# Patient Record
Sex: Male | Born: 1940 | Race: White | Hispanic: No | Marital: Married | Smoking: Former smoker
Health system: Southern US, Community
[De-identification: ages and names within clinical notes are randomized; demographics above are authoritative.]

## PROBLEM LIST (undated history)

## (undated) DIAGNOSIS — G20 Parkinson's disease: Secondary | ICD-10-CM

## (undated) HISTORY — PX: HIP SURGERY: SHX245

---

## 2015-04-03 ENCOUNTER — Emergency Department (HOSPITAL_COMMUNITY): Payer: Medicare Other

## 2015-04-03 ENCOUNTER — Inpatient Hospital Stay (HOSPITAL_COMMUNITY)
Admission: EM | Admit: 2015-04-03 | Discharge: 2015-04-14 | DRG: 922 | Disposition: A | Discharge status: Skilled Nursing Facility | Payer: Medicare Other | Attending: Internal Medicine | Admitting: Internal Medicine

## 2015-04-03 ENCOUNTER — Encounter (HOSPITAL_COMMUNITY): Payer: Self-pay | Admitting: *Deleted

## 2015-04-03 DIAGNOSIS — R569 Unspecified convulsions: Secondary | ICD-10-CM

## 2015-04-03 DIAGNOSIS — I10 Essential (primary) hypertension: Secondary | ICD-10-CM | POA: Diagnosis present

## 2015-04-03 DIAGNOSIS — X31XXXA Exposure to excessive natural cold, initial encounter: Secondary | ICD-10-CM | POA: Diagnosis not present

## 2015-04-03 DIAGNOSIS — L8990 Pressure ulcer of unspecified site, unspecified stage: Secondary | ICD-10-CM | POA: Diagnosis not present

## 2015-04-03 DIAGNOSIS — E874 Mixed disorder of acid-base balance: Secondary | ICD-10-CM | POA: Diagnosis present

## 2015-04-03 DIAGNOSIS — Z7982 Long term (current) use of aspirin: Secondary | ICD-10-CM | POA: Diagnosis not present

## 2015-04-03 DIAGNOSIS — G9340 Encephalopathy, unspecified: Secondary | ICD-10-CM | POA: Insufficient documentation

## 2015-04-03 DIAGNOSIS — E870 Hyperosmolality and hypernatremia: Secondary | ICD-10-CM | POA: Insufficient documentation

## 2015-04-03 DIAGNOSIS — J9602 Acute respiratory failure with hypercapnia: Secondary | ICD-10-CM | POA: Insufficient documentation

## 2015-04-03 DIAGNOSIS — J9600 Acute respiratory failure, unspecified whether with hypoxia or hypercapnia: Secondary | ICD-10-CM

## 2015-04-03 DIAGNOSIS — R4020 Unspecified coma: Secondary | ICD-10-CM | POA: Diagnosis present

## 2015-04-03 DIAGNOSIS — R001 Bradycardia, unspecified: Secondary | ICD-10-CM | POA: Diagnosis not present

## 2015-04-03 DIAGNOSIS — F0390 Unspecified dementia without behavioral disturbance: Secondary | ICD-10-CM | POA: Diagnosis present

## 2015-04-03 DIAGNOSIS — Z931 Gastrostomy status: Secondary | ICD-10-CM

## 2015-04-03 DIAGNOSIS — I4891 Unspecified atrial fibrillation: Secondary | ICD-10-CM | POA: Diagnosis present

## 2015-04-03 DIAGNOSIS — J9601 Acute respiratory failure with hypoxia: Secondary | ICD-10-CM | POA: Insufficient documentation

## 2015-04-03 DIAGNOSIS — D72829 Elevated white blood cell count, unspecified: Secondary | ICD-10-CM | POA: Diagnosis present

## 2015-04-03 DIAGNOSIS — Z87891 Personal history of nicotine dependence: Secondary | ICD-10-CM | POA: Diagnosis not present

## 2015-04-03 DIAGNOSIS — Z79899 Other long term (current) drug therapy: Secondary | ICD-10-CM

## 2015-04-03 DIAGNOSIS — J690 Pneumonitis due to inhalation of food and vomit: Secondary | ICD-10-CM | POA: Diagnosis present

## 2015-04-03 DIAGNOSIS — D696 Thrombocytopenia, unspecified: Secondary | ICD-10-CM | POA: Diagnosis not present

## 2015-04-03 DIAGNOSIS — G20 Parkinson's disease: Secondary | ICD-10-CM | POA: Diagnosis present

## 2015-04-03 DIAGNOSIS — T68XXXA Hypothermia, initial encounter: Principal | ICD-10-CM | POA: Diagnosis present

## 2015-04-03 DIAGNOSIS — R1310 Dysphagia, unspecified: Secondary | ICD-10-CM | POA: Insufficient documentation

## 2015-04-03 DIAGNOSIS — D62 Acute posthemorrhagic anemia: Secondary | ICD-10-CM | POA: Insufficient documentation

## 2015-04-03 DIAGNOSIS — L8995 Pressure ulcer of unspecified site, unstageable: Secondary | ICD-10-CM | POA: Diagnosis present

## 2015-04-03 DIAGNOSIS — E876 Hypokalemia: Secondary | ICD-10-CM | POA: Diagnosis not present

## 2015-04-03 DIAGNOSIS — R5381 Other malaise: Secondary | ICD-10-CM | POA: Diagnosis present

## 2015-04-03 DIAGNOSIS — D6489 Other specified anemias: Secondary | ICD-10-CM | POA: Diagnosis present

## 2015-04-03 DIAGNOSIS — E878 Other disorders of electrolyte and fluid balance, not elsewhere classified: Secondary | ICD-10-CM | POA: Diagnosis not present

## 2015-04-03 DIAGNOSIS — R740 Nonspecific elevation of levels of transaminase and lactic acid dehydrogenase [LDH]: Secondary | ICD-10-CM | POA: Diagnosis present

## 2015-04-03 DIAGNOSIS — I959 Hypotension, unspecified: Secondary | ICD-10-CM | POA: Diagnosis present

## 2015-04-03 DIAGNOSIS — R739 Hyperglycemia, unspecified: Secondary | ICD-10-CM | POA: Insufficient documentation

## 2015-04-03 DIAGNOSIS — G20A1 Parkinson's disease without dyskinesia, without mention of fluctuations: Secondary | ICD-10-CM | POA: Insufficient documentation

## 2015-04-03 DIAGNOSIS — M25521 Pain in right elbow: Secondary | ICD-10-CM | POA: Insufficient documentation

## 2015-04-03 HISTORY — DX: Parkinson's disease: G20

## 2015-04-03 LAB — BASIC METABOLIC PANEL
ANION GAP: 6 (ref 5–15)
Anion gap: 17 — ABNORMAL HIGH (ref 5–15)
BUN: 29 mg/dL — ABNORMAL HIGH (ref 6–20)
BUN: 27 mg/dL — ABNORMAL HIGH (ref 6–20)
CALCIUM: 7.8 mg/dL — AB (ref 8.9–10.3)
CO2: 16 mmol/L — AB (ref 22–32)
CO2: 22 mmol/L (ref 22–32)
CREATININE: 1.06 mg/dL (ref 0.61–1.24)
Calcium: 8.1 mg/dL — ABNORMAL LOW (ref 8.9–10.3)
Chloride: 110 mmol/L (ref 101–111)
Chloride: 116 mmol/L — ABNORMAL HIGH (ref 101–111)
Creatinine, Ser: 1.15 mg/dL (ref 0.61–1.24)
GFR calc Af Amer: >60 mL/min (ref >60)
GLUCOSE: 125 mg/dL — AB (ref 65–99)
Glucose, Bld: 89 mg/dL (ref 65–99)
Potassium: 3.5 mmol/L (ref 3.5–5.1)
Potassium: 3.8 mmol/L (ref 3.5–5.1)
SODIUM: 143 mmol/L (ref 135–145)
SODIUM: 144 mmol/L (ref 135–145)

## 2015-04-03 LAB — URINALYSIS, ROUTINE W REFLEX MICROSCOPIC
BILIRUBIN URINE: NEGATIVE
GLUCOSE, UA: 100 mg/dL — AB
Ketones, ur: 15 mg/dL — AB
Leukocytes, UA: NEGATIVE
Nitrite: NEGATIVE
PROTEIN: 30 mg/dL — AB
Specific Gravity, Urine: 1.023 (ref 1.005–1.030)
pH: 5 (ref 5.0–8.0)

## 2015-04-03 LAB — POCT I-STAT 3, ART BLOOD GAS (G3+)
ACID-BASE DEFICIT: 7 mmol/L — AB (ref 0.0–2.0)
ACID-BASE DEFICIT: 8 mmol/L — AB (ref 0.0–2.0)
Acid-base deficit: 7 mmol/L — ABNORMAL HIGH (ref 0.0–2.0)
BICARBONATE: 18.4 meq/L — AB (ref 20.0–24.0)
BICARBONATE: 17.9 meq/L — AB (ref 20.0–24.0)
Bicarbonate: 18.4 mEq/L — ABNORMAL LOW (ref 20.0–24.0)
O2 SAT: 99 %
O2 Saturation: 100 %
O2 Saturation: 99 %
PCO2 ART: 32.6 mmHg — AB (ref 35.0–45.0)
PH ART: 7.299 — AB (ref 7.350–7.450)
PH ART: 7.312 — AB (ref 7.350–7.450)
PO2 ART: 140 mmHg — AB (ref 80.0–100.0)
PO2 ART: 151 mmHg — AB (ref 80.0–100.0)
Patient temperature: 35.6
Patient temperature: 37.3
TCO2: 19 mmol/L (ref 0–100)
TCO2: 19 mmol/L (ref 0–100)
TCO2: 19 mmol/L (ref 0–100)
pCO2 arterial: 35.8 mmHg (ref 35.0–45.0)
pCO2 arterial: 36.5 mmHg (ref 35.0–45.0)
pH, Arterial: 7.344 — ABNORMAL LOW (ref 7.350–7.450)
pO2, Arterial: 190 mmHg — ABNORMAL HIGH (ref 80.0–100.0)

## 2015-04-03 LAB — URINE MICROSCOPIC-ADD ON
RBC / HPF: NONE SEEN RBC/hpf (ref 0–5)
Squamous Epithelial / LPF: NONE SEEN
WBC, UA: NONE SEEN WBC/hpf (ref 0–5)

## 2015-04-03 LAB — HEPATIC FUNCTION PANEL
ALT: 7 U/L — ABNORMAL LOW (ref 17–63)
AST: 33 U/L (ref 15–41)
Albumin: 3.4 g/dL — ABNORMAL LOW (ref 3.5–5.0)
Alkaline Phosphatase: 55 U/L (ref 38–126)
Bilirubin, Direct: 0.2 mg/dL (ref 0.1–0.5)
Indirect Bilirubin: 0.4 mg/dL (ref 0.3–0.9)
Total Bilirubin: 0.6 mg/dL (ref 0.3–1.2)
Total Protein: 5.8 g/dL — ABNORMAL LOW (ref 6.5–8.1)

## 2015-04-03 LAB — CK TOTAL AND CKMB (NOT AT ARMC)
CK, MB: 23.1 ng/mL — ABNORMAL HIGH (ref 0.5–5.0)
Relative Index: 2.2 (ref 0.0–2.5)
Total CK: 1047 U/L — ABNORMAL HIGH (ref 49–397)

## 2015-04-03 LAB — I-STAT ARTERIAL BLOOD GAS, ED
Acid-base deficit: 10 mmol/L — ABNORMAL HIGH (ref 0.0–2.0)
Bicarbonate: 17.3 mEq/L — ABNORMAL LOW (ref 20.0–24.0)
O2 SAT: 100 %
PCO2 ART: 30.7 mmHg — AB (ref 35.0–45.0)
PH ART: 7.321 — AB (ref 7.350–7.450)
PO2 ART: 184 mmHg — AB (ref 80.0–100.0)
Patient temperature: 30.3
TCO2: 19 mmol/L (ref 0–100)

## 2015-04-03 LAB — MAGNESIUM: MAGNESIUM: 2.1 mg/dL (ref 1.7–2.4)

## 2015-04-03 LAB — LACTIC ACID, PLASMA: LACTIC ACID, VENOUS: 0.9 mmol/L (ref 0.5–2.0)

## 2015-04-03 LAB — CBC
HEMATOCRIT: 39 % (ref 39.0–52.0)
HEMOGLOBIN: 12.7 g/dL — AB (ref 13.0–17.0)
MCH: 27.5 pg (ref 26.0–34.0)
MCHC: 32.6 g/dL (ref 30.0–36.0)
MCV: 84.4 fL (ref 78.0–100.0)
Platelets: 203 10*3/uL (ref 150–400)
RBC: 4.62 MIL/uL (ref 4.22–5.81)
RDW: 13.6 % (ref 11.5–15.5)
WBC: 17.5 10*3/uL — ABNORMAL HIGH (ref 4.0–10.5)

## 2015-04-03 LAB — I-STAT CG4 LACTIC ACID, ED: LACTIC ACID, VENOUS: 8.25 mmol/L — AB (ref 0.5–2.0)

## 2015-04-03 LAB — MRSA PCR SCREENING: MRSA BY PCR: NEGATIVE

## 2015-04-03 LAB — GLUCOSE, CAPILLARY
Glucose-Capillary: 181 mg/dL — ABNORMAL HIGH (ref 65–99)
Glucose-Capillary: 77 mg/dL (ref 65–99)

## 2015-04-03 MED ORDER — PROPOFOL 1000 MG/100ML IV EMUL
INTRAVENOUS | Status: AC
  Filled 2015-04-03: qty 100

## 2015-04-03 MED ORDER — ETOMIDATE 2 MG/ML IV SOLN
INTRAVENOUS | Status: DC | PRN
  Administered 2015-04-03: 20 mg via INTRAVENOUS

## 2015-04-03 MED ORDER — PANTOPRAZOLE SODIUM 40 MG IV SOLR
40.0000 mg | Freq: Every day | INTRAVENOUS | Status: DC
  Administered 2015-04-03 – 2015-04-07 (×5): 40 mg via INTRAVENOUS
  Filled 2015-04-03 (×5): qty 40

## 2015-04-03 MED ORDER — LORAZEPAM 2 MG/ML IJ SOLN
INTRAMUSCULAR | Status: AC
  Filled 2015-04-03: qty 3

## 2015-04-03 MED ORDER — LORAZEPAM 2 MG/ML IJ SOLN
INTRAMUSCULAR | Status: AC
  Filled 2015-04-03: qty 1

## 2015-04-03 MED ORDER — FENTANYL CITRATE (PF) 100 MCG/2ML IJ SOLN
INTRAMUSCULAR | Status: AC
  Administered 2015-04-04: 50 ug via INTRAVENOUS
  Filled 2015-04-03: qty 2

## 2015-04-03 MED ORDER — LORAZEPAM 2 MG/ML IJ SOLN
2.0000 mg | Freq: Once | INTRAMUSCULAR | Status: AC
  Administered 2015-04-03: 2 mg via INTRAVENOUS

## 2015-04-03 MED ORDER — FENTANYL CITRATE (PF) 100 MCG/2ML IJ SOLN
50.0000 ug | INTRAMUSCULAR | Status: DC | PRN
  Administered 2015-04-04 – 2015-04-05 (×4): 50 ug via INTRAVENOUS
  Filled 2015-04-03 (×4): qty 2

## 2015-04-03 MED ORDER — SODIUM CHLORIDE 0.9 % IV BOLUS (SEPSIS)
1000.0000 mL | Freq: Once | INTRAVENOUS | Status: AC
  Administered 2015-04-03: 1000 mL via INTRAVENOUS

## 2015-04-03 MED ORDER — CHLORHEXIDINE GLUCONATE 0.12% ORAL RINSE (MEDLINE KIT)
15.0000 mL | Freq: Two times a day (BID) | OROMUCOSAL | Status: DC
  Administered 2015-04-03 – 2015-04-08 (×9): 15 mL via OROMUCOSAL

## 2015-04-03 MED ORDER — FENTANYL CITRATE (PF) 100 MCG/2ML IJ SOLN
50.0000 ug | INTRAMUSCULAR | Status: AC | PRN
  Administered 2015-04-03 – 2015-04-04 (×3): 50 ug via INTRAVENOUS
  Filled 2015-04-03 (×2): qty 2

## 2015-04-03 MED ORDER — PANTOPRAZOLE SODIUM 40 MG IV SOLR
80.0000 mg | Freq: Once | INTRAVENOUS | Status: AC
  Administered 2015-04-03: 80 mg via INTRAVENOUS
  Filled 2015-04-03 (×2): qty 80

## 2015-04-03 MED ORDER — HEPARIN SODIUM (PORCINE) 5000 UNIT/ML IJ SOLN
5000.0000 [IU] | Freq: Three times a day (TID) | INTRAMUSCULAR | Status: DC
  Administered 2015-04-04 – 2015-04-14 (×29): 5000 [IU] via SUBCUTANEOUS
  Filled 2015-04-03 (×29): qty 1

## 2015-04-03 MED ORDER — DEXTROSE 50 % IV SOLN
INTRAVENOUS | Status: AC
  Administered 2015-04-03: 50 mL
  Filled 2015-04-03: qty 50

## 2015-04-03 MED ORDER — LORAZEPAM 2 MG/ML IJ SOLN
6.0000 mg | Freq: Once | INTRAMUSCULAR | Status: AC
  Administered 2015-04-03: 6 mg via INTRAVENOUS

## 2015-04-03 MED ORDER — SODIUM CHLORIDE 0.9 % IV SOLN: INTRAVENOUS | Status: DC | PRN

## 2015-04-03 MED ORDER — PHENYLEPHRINE HCL 10 MG/ML IJ SOLN
30.0000 ug/min | INTRAVENOUS | Status: DC
  Administered 2015-04-03: 30 ug/min via INTRAVENOUS
  Administered 2015-04-04: 15 ug/min via INTRAVENOUS
  Administered 2015-04-04: 35 ug/min via INTRAVENOUS
  Filled 2015-04-03 (×3): qty 1

## 2015-04-03 MED ORDER — ASPIRIN 81 MG PO CHEW: 324.0000 mg | CHEWABLE_TABLET | ORAL | Status: AC

## 2015-04-03 MED ORDER — SODIUM CHLORIDE 0.9 % IV BOLUS (SEPSIS): 500.0000 mL | Freq: Once | INTRAVENOUS | Status: DC

## 2015-04-03 MED ORDER — SODIUM CHLORIDE 0.9 % IV BOLUS (SEPSIS)
2000.0000 mL | Freq: Once | INTRAVENOUS | Status: AC
  Administered 2015-04-03: 2000 mL via INTRAVENOUS

## 2015-04-03 MED ORDER — SODIUM CHLORIDE 0.9 % IV SOLN
250.0000 mL | INTRAVENOUS | Status: DC | PRN
  Administered 2015-04-04: 250 mL via INTRAVENOUS

## 2015-04-03 MED ORDER — ANTISEPTIC ORAL RINSE SOLUTION (CORINZ)
7.0000 mL | Freq: Four times a day (QID) | OROMUCOSAL | Status: DC
  Administered 2015-04-03 – 2015-04-08 (×19): 7 mL via OROMUCOSAL

## 2015-04-03 MED ORDER — SODIUM CHLORIDE 0.9 % IV BOLUS (SEPSIS)
500.0000 mL | Freq: Once | INTRAVENOUS | Status: AC
  Administered 2015-04-03: 500 mL via INTRAVENOUS

## 2015-04-03 MED ORDER — PROPOFOL 1000 MG/100ML IV EMUL: 5.0000 ug/kg/min | Freq: Once | INTRAVENOUS | Status: DC

## 2015-04-03 MED ORDER — ASPIRIN 300 MG RE SUPP
300.0000 mg | RECTAL | Status: AC
Start: 2015-04-03 — End: 2015-04-04

## 2015-04-03 MED ORDER — SODIUM CHLORIDE 0.9 % IV SOLN
Freq: Once | INTRAVENOUS | Status: AC
  Administered 2015-04-03: 125 mL/h via INTRAVENOUS

## 2015-04-03 MED ORDER — SUCCINYLCHOLINE CHLORIDE 20 MG/ML IJ SOLN
INTRAMUSCULAR | Status: DC | PRN
  Administered 2015-04-03: 100 mg via INTRAVENOUS

## 2015-04-03 MED ORDER — DEXTROSE 50 % IV SOLN
1.0000 | Freq: Once | INTRAVENOUS | Status: AC
  Administered 2015-04-03: 50 mL via INTRAVENOUS

## 2015-04-03 MED ORDER — SODIUM CHLORIDE 0.9 % IV SOLN
INTRAVENOUS | Status: DC
  Administered 2015-04-03 – 2015-04-04 (×3): via INTRAVENOUS

## 2015-04-03 MED ORDER — DEXMEDETOMIDINE HCL IN NACL 200 MCG/50ML IV SOLN
0.0000 ug/kg/h | INTRAVENOUS | Status: DC
  Administered 2015-04-03: 0.6 ug/kg/h via INTRAVENOUS
  Administered 2015-04-03: 0.4 ug/kg/h via INTRAVENOUS
  Filled 2015-04-03 (×2): qty 50

## 2015-04-03 NOTE — ED Notes (Signed)
Family at bedside. 

## 2015-04-03 NOTE — Progress Notes (Signed)
CBG 77, hypoglycemia protocol started. D50 given. Repeat CBG 181. Will notify MD on call.

## 2015-04-03 NOTE — Progress Notes (Signed)
eLink Physician-Brief Progress Note Patient Name: Kyle Morrow DOB: 11/12/1940 MRN: [id number]   Date of Service  04/03/2015  HPI/Events of Note  Soft BP,bradycardic  eICU Interventions  Dc precedex NS 500 bolus - warm     Intervention Category Major Interventions: Hypotension - evaluation and management  Jara Feider V. 04/03/2015, 6:07 PM

## 2015-04-03 NOTE — ED Provider Notes (Signed)
CSN: [id number]     Arrival date & time 04/03/15  1151 History   First MD Initiated Contact with Patient 04/03/15 1208     Chief Complaint  Patient presents with  . Seizures    HPI will 5 caveat due to consciousness  Patient brought by EMS, called out for patient down. Therefore patient was outside of his house, last seen at 1 AM last night approximately 12 hours prior. Patient was seizing, cold to touch, nonresponsive. Patient was given Versed in route, nasal cannula, oxygen.   At the time of my evaluation patient was unconscious, not responsive to painful stimuli, pupils were 2 mm, reactive, patient appeared to be shivering, uncertain if this was sugars/Parkinson, did not appear to be seizure-like activity. Patient had multiple abrasions to the left upper extremity, lower extremities.   Patient has a significant past medical history of Parkinson's disease, no history of seizure.  Family at bedside after initial evaluation reporting patient was found down around 10 AM this morning, no history of seizure, only significant past medical history Parkinson's disease [facility]    Past Medical History  Diagnosis Date  . Parkinson disease [facility])    Past Surgical History  Procedure Laterality Date  . Hip surgery      Left   No family history on file. Social History  Substance Use Topics  . Smoking status: Former Smoker    Quit date: 03/26/1968  . Smokeless tobacco: None  . Alcohol Use: No    Review of Systems  Unable to perform ROS: Patient unresponsive     Allergies  Review of patient's allergies indicates no known allergies.  Home Medications   Prior to Admission medications   Medication Sig Start Date End Date Taking? Authorizing Provider  aspirin EC 325 MG tablet Take 325 mg by mouth daily as needed (pain).   Yes Historical Provider, MD  Carbidopa-Levodopa (DUOPA EN) by Enteral route See admin instructions. Pump administers 4.63 mg/20 mg per ml  - connect to portal every morning between 9am & 11am, disconnect 12-13 hours later   Yes Historical Provider, MD  carbidopa-levodopa (SINEMET IR) 25-100 MG tablet Take 2 tablets by mouth See admin instructions. Take 2 tablets by mouth when Duopa pump is removed each evening   Yes Historical Provider, MD  donepezil (ARICEPT) 5 MG tablet Take 5 mg by mouth at bedtime.   Yes Historical Provider, MD  escitalopram (LEXAPRO) 10 MG tablet Take 10 mg by mouth daily.   Yes Historical Provider, MD   BP 92/67 mmHg  Pulse 63  Resp 14  Ht 5\' 8"  (1.727 m)  Wt 85.276 kg  BMI 28.59 kg/m2  SpO2 100% Physical Exam  Constitutional:  Nonresponsive  Pulmonary/Chest: He is in respiratory distress.  Musculoskeletal:  Multiple abrasions to the upper extremity.  Nursing note and vitals reviewed.   ED Course  Procedures (including critical care time)  INTUBATION Performed by: Thermon Leyland  Required items: required blood products, implants, devices, and special equipment available Patient identity confirmed: provided demographic data and hospital-assigned identification number Time out: Immediately prior to procedure a "time out" was called to verify the correct patient, procedure, equipment, support staff and site/side marked as required.  Indications: Unconscious not protecting airway   Intubation method: Glidescope Laryngoscopy   Preoxygenation: On rebreather   Sedatives: 20 Etomidate Paralytic: 100 Succinylcholine  Tube Size:  cuffed  Post-procedure assessment: chest rise and ETCO2 monitor Breath sounds: equal and absent over the epigastrium  Tube secured with: ETT holder Chest x-ray interpreted by radiologist and me.  Chest x-ray findings: endotracheal tube in appropriate position  Patient tolerated the procedure well with no immediate complications.   Labs Review Labs Reviewed  CK TOTAL AND CKMB (NOT AT [facility]) - Abnormal; Notable for the following:    Total CK 1047 (*)    CK,  MB 23.1 (*)    All other components within normal limits  BASIC METABOLIC PANEL - Abnormal; Notable for the following:    CO2 16 (*)    Glucose, Bld 125 (*)    BUN 29 (*)    Calcium 8.1 (*)    Anion gap 17 (*)    All other components within normal limits  CBC - Abnormal; Notable for the following:    WBC 17.5 (*)    Hemoglobin 12.7 (*)    All other components within normal limits  HEPATIC FUNCTION PANEL - Abnormal; Notable for the following:    Total Protein 5.8 (*)    Albumin 3.4 (*)    ALT 7 (*)    All other components within normal limits  I-STAT CG4 LACTIC ACID, ED - Abnormal; Notable for the following:    Lactic Acid, Venous 8.25 (*)    All other components within normal limits  I-STAT ARTERIAL BLOOD GAS, ED - Abnormal; Notable for the following:    pH, Arterial 7.321 (*)    pCO2 arterial 30.7 (*)    pO2, Arterial 184.0 (*)    Bicarbonate 17.3 (*)    Acid-base deficit 10.0 (*)    All other components within normal limits  BLOOD GAS, ARTERIAL  CBG MONITORING, ED  I-STAT CG4 LACTIC ACID, ED    Imaging Review Ct Head Wo Contrast  04/03/2015  CLINICAL DATA:  75 year old male found down outside EXAM: CT HEAD WITHOUT CONTRAST CT CERVICAL SPINE WITHOUT CONTRAST TECHNIQUE: Multidetector CT imaging of the head and cervical spine was performed following the standard protocol without intravenous contrast. Multiplanar CT image reconstructions of the cervical spine were also generated. COMPARISON:  None. FINDINGS: CT HEAD FINDINGS Negative for acute intracranial hemorrhage, acute infarction, mass, mass effect, hydrocephalus or midline shift. Gray-white differentiation is preserved throughout. Mild cerebral cortical atrophy. Small well-defined low-attenuation focus in the right putaminal most consistent with a remote lacunar infarct. No focal scalp contusion or hematoma. Globes and orbits are symmetric and intact bilaterally. No focal calvarial fracture. Partial opacification of several  scattered right-sided ethmoid air cells. Atherosclerotic calcifications in both cavernous carotid arteries. CT CERVICAL SPINE FINDINGS Moderate image degradation secondary to patient motion. No acute fracture, malalignment or prevertebral soft tissue swelling. Multilevel cervical spondylosis. Mild (2 mm) degenerative anterolisthesis of C5 on C6. Right greater than left facet arthropathy. The patient is intubated and a nasogastric tube are present. Unremarkable CT appearance of the thyroid gland. No acute soft tissue abnormality. The lung apices are unremarkable. IMPRESSION: CT HEAD 1. No acute intracranial abnormality. 2. Cerebral cortical atrophy. 3. Remote right basal ganglia lacunar infarct. 4. Atherosclerotic vascular calcifications in the intracranial carotid arteries. CT CSPINE 1. No evidence of acute fracture or malalignment. 2. Multilevel cervical spondylosis including mild degenerative anterolisthesis of C5 on C6. 3. Right greater than left facet arthropathy. Electronically Signed   By: Malachy Moan M.D.   On: 04/03/2015 13:52   Ct Cervical Spine Wo Contrast  04/03/2015  CLINICAL DATA:  75 year old male found down outside EXAM: CT HEAD WITHOUT CONTRAST CT CERVICAL SPINE WITHOUT CONTRAST TECHNIQUE: Multidetector CT imaging of  the head and cervical spine was performed following the standard protocol without intravenous contrast. Multiplanar CT image reconstructions of the cervical spine were also generated. COMPARISON:  None. FINDINGS: CT HEAD FINDINGS Negative for acute intracranial hemorrhage, acute infarction, mass, mass effect, hydrocephalus or midline shift. Gray-white differentiation is preserved throughout. Mild cerebral cortical atrophy. Small well-defined low-attenuation focus in the right putaminal most consistent with a remote lacunar infarct. No focal scalp contusion or hematoma. Globes and orbits are symmetric and intact bilaterally. No focal calvarial fracture. Partial opacification of  several scattered right-sided ethmoid air cells. Atherosclerotic calcifications in both cavernous carotid arteries. CT CERVICAL SPINE FINDINGS Moderate image degradation secondary to patient motion. No acute fracture, malalignment or prevertebral soft tissue swelling. Multilevel cervical spondylosis. Mild (2 mm) degenerative anterolisthesis of C5 on C6. Right greater than left facet arthropathy. The patient is intubated and a nasogastric tube are present. Unremarkable CT appearance of the thyroid gland. No acute soft tissue abnormality. The lung apices are unremarkable. IMPRESSION: CT HEAD 1. No acute intracranial abnormality. 2. Cerebral cortical atrophy. 3. Remote right basal ganglia lacunar infarct. 4. Atherosclerotic vascular calcifications in the intracranial carotid arteries. CT CSPINE 1. No evidence of acute fracture or malalignment. 2. Multilevel cervical spondylosis including mild degenerative anterolisthesis of C5 on C6. 3. Right greater than left facet arthropathy. Electronically Signed   By: Malachy MoanHeath  McCullough M.D.   On: 04/03/2015 13:52   Dg Pelvis Portable  04/03/2015  CLINICAL DATA:  Seizure EXAM: PORTABLE PELVIS 1-2 VIEWS COMPARISON:  None FINDINGS: There is a left hip arthroplasty device. Moderate degenerative changes are noted involving the right hip. Rectal temperature probe noted. IMPRESSION: 1. No acute findings. Electronically Signed   By: Signa Kellaylor  Stroud M.D.   On: 04/03/2015 13:03   Dg Chest Portable 1 View  04/03/2015  CLINICAL DATA:  Seizure EXAM: PORTABLE CHEST 1 VIEW COMPARISON:  None FINDINGS: The ET tube tip is above the carina. There is a nasogastric tube with tip in the stomach. Normal heart size. No pleural effusion or edema. No airspace consolidation. IMPRESSION: 1. No acute cardiopulmonary abnormalities. 2. Satisfactory position of the support apparatus. Electronically Signed   By: Signa Kellaylor  Stroud M.D.   On: 04/03/2015 13:00   I have personally reviewed and evaluated these  images and lab results as part of my medical decision-making.   EKG Interpretation   Date/Time:  Sunday April 03 2015 12:04:59 EST Ventricular Rate:  90 PR Interval:    QRS Duration: 116 QT Interval:  461 QTC Calculation: 564 R Axis:   68 Text Interpretation:  Atrial fibrillation Osborn waves Nonspecific  intraventricular conduction delay Borderline repolarization abnormality  Reconfirmed by RAY MD, Duwayne HeckANIELLE [id number]) on 04/03/2015 12:45:18 PM      MDM   Final diagnoses:  Hypothermia, initial encounter  Seizure (HCC)    Labs: Hepatic function, CBG, BMP, CBC, CK  Imaging: CT head without contrast, CT cervical spine without contrast, DG pelvis portable, DG chest portable  Consults:  Therapeutics: Ativan, succinylcholine, etomidate  Discharge Meds:   Assessment/Plan:  Patient found down, seizure-like activity. Patient intubated here in the ED,  bear hugger applied, temp Foley placed. Imaging as above. Patient hypotensive with initial blood pressure 80 systolic range. Warm normal saline bolus given. She was cold to touch, difficulty with equipment obtaining temp Foley temperature, she was found to have a temperature of 23C (73.4 Fahrenheit)   Patient  having seizure-like activity, he was given 6 mg of numerous doses of Ativan totaling 10 mg  here in the ED, patient continued to have seizure-like activity. Precedex started.   Critical care consulted      Eyvonne Mechanic, PA-C 04/03/15 1558  Margarita Grizzle, MD 04/04/15 [phone]

## 2015-04-03 NOTE — Progress Notes (Signed)
PULMONARY / CRITICAL CARE MEDICINE   Name: Abdulraheem Pineo MRN: [id number] DOB: Dec 03, 1940    ADMISSION DATE:  04/03/2015 CONSULTATION DATE:  04/03/15  REFERRING MD:  ED (Ray)  CHIEF COMPLAINT:  Found down outside  HISTORY OF PRESENT ILLNESS:   Mr. Daymion Nazaire is a 75 y.o. male w/ PMHx of Parkinson's Disease with mild cognitive impairment, presents to the ED via EMS after being found down outside of his house. He was last seen at 1 AM last night, approximately 12 hours prior to being taken to the ED. Patient was found seizing, cold to the touch, and unresponsive. He was given Versed en route to the ED, placed on [place]. At time of evaluation in the ED, patient was unconscious, unresponsive to painful stimuli, pupils 2 mm, reactive, shivering, and did not appear to be having seizure activity. Multiple abrasions seen in the LUE.    Temp noted to be 1 F in the ED, increased to 46 F most recently.    PAST MEDICAL HISTORY :  He  has a past medical history of Parkinson disease (HCC).  PAST SURGICAL HISTORY: He  has past surgical history that includes Hip surgery.  No Known Allergies  No current facility-administered medications on file prior to encounter.   No current outpatient prescriptions on file prior to encounter.    FAMILY HISTORY:  His has no family status information on file.   SOCIAL HISTORY: He  reports that he quit smoking about 47 years ago. He does not have any smokeless tobacco history on file. He reports that he does not drink alcohol or use illicit drugs.  REVIEW OF SYSTEMS:   Unable to obtain given patient is on ventilator, intubated.   SUBJECTIVE:  Intubated, sedated, on Precedex gtt. Family at bedside.   VITAL SIGNS: BP 99/76 mmHg  Pulse 81  Resp 13  Ht 5\' 8"  (1.727 m)  Wt 188 lb (85.276 kg)  BMI 28.59 kg/m2  SpO2 100%  HEMODYNAMICS:    VENTILATOR SETTINGS: Vent Mode:  [-] PRVC FiO2 (%):  [100 %] 100 % Set Rate:  [12 bmp] 12 bmp Vt Set:  [6100 mL] 6100  mL PEEP:  [5 cmH20] 5 cmH20 Plateau Pressure:  [18 cmH20] 18 cmH20  INTAKE / OUTPUT:    PHYSICAL EXAMINATION: General:  Sedated, intubated, on vent.  Neuro:  GCS 3. Pupils pinpoint. Non-reactive, equal. Possible extensor reflex on Babinski. No purposeful movements.  HEENT:  Pupils as above. Dry mucus membranes.  Cardiovascular:  Mild tachycardia, irregular. No murmurs, gallops, or rubs Lungs:  Clear bilaterally. No wheezes, rales, or rhonchi.  Abdomen:  Soft, non-distended. BS + Musculoskeletal: Extremities cool to the touch, no edema.  Skin: Pallor, scattered abrasions.   LABS:  BMET  Recent Labs Lab 04/03/15 1246  NA 143  K 3.5  CL 110  CO2 16*  BUN 29*  CREATININE 1.15  GLUCOSE 125*    Electrolytes  Recent Labs Lab 04/03/15 1246  CALCIUM 8.1*    CBC  Recent Labs Lab 04/03/15 1246  WBC 17.5*  HGB 12.7*  HCT 39.0  PLT 203     Sepsis Markers  Recent Labs Lab 04/03/15 1244  LATICACIDVEN 8.25*    ABG  Recent Labs Lab 04/03/15 1349  PHART 7.321*  PCO2ART 30.7*  PO2ART 184.0*    Liver Enzymes  Recent Labs Lab 04/03/15 1247  AST 33  ALT 7*  ALKPHOS 55  BILITOT 0.6  ALBUMIN 3.4*    Cardiac Enzymes No results for  input(s): TROPONINI, PROBNP in the last 168 hours.  Glucose No results for input(s): GLUCAP in the last 168 hours.  Imaging Ct Head Wo Contrast  04/03/2015  CLINICAL DATA:  75 year old male found down outside EXAM: CT HEAD WITHOUT CONTRAST CT CERVICAL SPINE WITHOUT CONTRAST TECHNIQUE: Multidetector CT imaging of the head and cervical spine was performed following the standard protocol without intravenous contrast. Multiplanar CT image reconstructions of the cervical spine were also generated. COMPARISON:  None. FINDINGS: CT HEAD FINDINGS Negative for acute intracranial hemorrhage, acute infarction, mass, mass effect, hydrocephalus or midline shift. Gray-white differentiation is preserved throughout. Mild cerebral cortical  atrophy. Small well-defined low-attenuation focus in the right putaminal most consistent with a remote lacunar infarct. No focal scalp contusion or hematoma. Globes and orbits are symmetric and intact bilaterally. No focal calvarial fracture. Partial opacification of several scattered right-sided ethmoid air cells. Atherosclerotic calcifications in both cavernous carotid arteries. CT CERVICAL SPINE FINDINGS Moderate image degradation secondary to patient motion. No acute fracture, malalignment or prevertebral soft tissue swelling. Multilevel cervical spondylosis. Mild (2 mm) degenerative anterolisthesis of C5 on C6. Right greater than left facet arthropathy. The patient is intubated and a nasogastric tube are present. Unremarkable CT appearance of the thyroid gland. No acute soft tissue abnormality. The lung apices are unremarkable. IMPRESSION: CT HEAD 1. No acute intracranial abnormality. 2. Cerebral cortical atrophy. 3. Remote right basal ganglia lacunar infarct. 4. Atherosclerotic vascular calcifications in the intracranial carotid arteries. CT CSPINE 1. No evidence of acute fracture or malalignment. 2. Multilevel cervical spondylosis including mild degenerative anterolisthesis of C5 on C6. 3. Right greater than left facet arthropathy. Electronically Signed   By: Malachy Moan M.D.   On: 04/03/2015 13:52   Ct Cervical Spine Wo Contrast  04/03/2015  CLINICAL DATA:  75 year old male found down outside EXAM: CT HEAD WITHOUT CONTRAST CT CERVICAL SPINE WITHOUT CONTRAST TECHNIQUE: Multidetector CT imaging of the head and cervical spine was performed following the standard protocol without intravenous contrast. Multiplanar CT image reconstructions of the cervical spine were also generated. COMPARISON:  None. FINDINGS: CT HEAD FINDINGS Negative for acute intracranial hemorrhage, acute infarction, mass, mass effect, hydrocephalus or midline shift. Gray-white differentiation is preserved throughout. Mild cerebral  cortical atrophy. Small well-defined low-attenuation focus in the right putaminal most consistent with a remote lacunar infarct. No focal scalp contusion or hematoma. Globes and orbits are symmetric and intact bilaterally. No focal calvarial fracture. Partial opacification of several scattered right-sided ethmoid air cells. Atherosclerotic calcifications in both cavernous carotid arteries. CT CERVICAL SPINE FINDINGS Moderate image degradation secondary to patient motion. No acute fracture, malalignment or prevertebral soft tissue swelling. Multilevel cervical spondylosis. Mild (2 mm) degenerative anterolisthesis of C5 on C6. Right greater than left facet arthropathy. The patient is intubated and a nasogastric tube are present. Unremarkable CT appearance of the thyroid gland. No acute soft tissue abnormality. The lung apices are unremarkable. IMPRESSION: CT HEAD 1. No acute intracranial abnormality. 2. Cerebral cortical atrophy. 3. Remote right basal ganglia lacunar infarct. 4. Atherosclerotic vascular calcifications in the intracranial carotid arteries. CT CSPINE 1. No evidence of acute fracture or malalignment. 2. Multilevel cervical spondylosis including mild degenerative anterolisthesis of C5 on C6. 3. Right greater than left facet arthropathy. Electronically Signed   By: Malachy Moan M.D.   On: 04/03/2015 13:52   Dg Pelvis Portable  04/03/2015  CLINICAL DATA:  Seizure EXAM: PORTABLE PELVIS 1-2 VIEWS COMPARISON:  None FINDINGS: There is a left hip arthroplasty device. Moderate degenerative changes are noted  involving the right hip. Rectal temperature probe noted. IMPRESSION: 1. No acute findings. Electronically Signed   By: Signa Kellaylor  Stroud M.D.   On: 04/03/2015 13:03   Dg Chest Portable 1 View  04/03/2015  CLINICAL DATA:  Seizure EXAM: PORTABLE CHEST 1 VIEW COMPARISON:  None FINDINGS: The ET tube tip is above the carina. There is a nasogastric tube with tip in the stomach. Normal heart size. No pleural  effusion or edema. No airspace consolidation. IMPRESSION: 1. No acute cardiopulmonary abnormalities. 2. Satisfactory position of the support apparatus. Electronically Signed   By: Signa Kellaylor  Stroud M.D.   On: 04/03/2015 13:00    STUDIES:  CT head 1/8 >> No acute fracture, malalignment or prevertebral soft tissue swelling. CT Cervical Spine 1/8 >> Multilevel cervical spondylosis. Mild (2 mm) degenerative anterolisthesis of C5 on C6.  EKG 1/8 >> Osborn waves  CULTURES: None  ANTIBIOTICS: None  SIGNIFICANT EVENTS: 1/8 Admit  LINES/TUBES: ETT 1/8 >>  DISCUSSION: 75 y.o. male w/ PMHx of Parkinson's Disease, found down outside of his home, admitted for severe hypothermia, temp 73 F in the ED.   ASSESSMENT / PLAN:  PULMONARY A: Ventilator Dependent Respiratory Failure Compensatory Respiratory Alkalosis P:   Full vent support Weaning trial when able CXR in AM  CARDIOVASCULAR A:  Hypotension Intermittent Atrial Fibrillation P:  IVF @ 125 cc/hr given hypotension Close monitoring with telemetry  RENAL A:   Anion Gap Metabolic Acidosis Lactic Acidosis P:   IVF as above Repeat BMP in PM  GASTROINTESTINAL A:   SUP  Blood in OGT P:   Protonix Repeat CBC in AM  HEMATOLOGIC A:   Leukocytosis Normocytic Anemia of critical illness P:  CBC in AM  INFECTIOUS A:   No acute issues P:   CBC as above  ENDOCRINE A:   Mild hyperglycemia   P:   Monitor glucose on BMP  NEUROLOGIC A:   Severe Hypothermia New onset seizures; likely 2/2 hypothermia Parkinson's Disease Mild Cognitive Impairment P:   IVF as above, bearhugger; rate of increase 2C/hr Frequent vitals RASS goal: -2 Propofol gtt Fentanyl Versed prn Resume Carbidopa/Levodopa when able Hold Aricept for now   FAMILY  - Updates: Sons, wife updated at bedside 1/8   Lauris ChromanWoody Eudora Guevarra, MD PGY-3, Internal Medicine Pager: [phone]   04/03/2015, 3:00 PM

## 2015-04-03 NOTE — ED Notes (Signed)
Critical Care MD at bedside.  

## 2015-04-03 NOTE — ED Notes (Signed)
Pt received 2 mg Ativan upon return from CT d/t actively seizing, verbal order from Rosalia Hammersay, MD

## 2015-04-03 NOTE — ED Notes (Signed)
Verbal order from Hedges, PA to remove towel roll for c spine immobilization, this RN removed towel roll

## 2015-04-03 NOTE — ED Notes (Signed)
Foley temp reading 84.38

## 2015-04-03 NOTE — Progress Notes (Signed)
Pt transported to 2H on vent, no complication.

## 2015-04-03 NOTE — Progress Notes (Signed)
eLink Physician-Brief Progress Note Patient Name: Rex KrasCharles Shadrick DOB: 08/19/1940 MRN: [id number]   Date of Service  04/03/2015  HPI/Events of Note  Remains hypotensive after 3.5 L fluids Warming up  eICU Interventions  500 bolus Then neo gtt     Intervention Category Major Interventions: Hypotension - evaluation and management  ALVA,RAKESH V. 04/03/2015, 7:06 PM

## 2015-04-03 NOTE — Progress Notes (Signed)
Pt transported to CT on vent without complication  

## 2015-04-03 NOTE — ED Notes (Signed)
[facility], per report the pt was found outside, last seen 1am, pt found to be having a seizure, pt had 5 mg Versed IM, pt has skin tears to L forearm, pt has Nasal airway in place on NRB, pt unresponsive & cold to touch upon arrival

## 2015-04-03 NOTE — ED Notes (Signed)
Pt received 2mg  Ativan while in CT d/t actively seizing

## 2015-04-03 NOTE — Progress Notes (Signed)
eLink Physician-Brief Progress Note Patient Name: Kyle KrasCharles Morrow DOB: 09/14/1940 MRN: [id number]   Date of Service  04/03/2015  HPI/Events of Note    eICU Interventions  A-line, then levo gtt if BP confirmed low     Intervention Category Major Interventions: Hypotension - evaluation and management  Parthiv Mucci V. 04/03/2015, 7:41 PM

## 2015-04-03 NOTE — ED Notes (Signed)
Foley temp reading 87.26

## 2015-04-03 NOTE — ED Notes (Signed)
Foley temp reading 85.46

## 2015-04-03 NOTE — ED Provider Notes (Signed)
75 year old man history of Parkinson'Morrow found outside in freezing temperatures with unknown time of exposure. Prehospital thermometer only reach down to 85 and their thermometer read low they report that he had prehospital seizure type activity. On arrival here he has altered mental status and was intubated only after arrival. Warming measures ensued with warm IV fluids and bair  hugger. He has had labile blood pressure with current blood pressure 105/54. Has required 12 mg of Ativan to control seizure type activity.   EKG Interpretation  Date/Time:  Sunday April 03 2015 12:04:59 EST Ventricular Rate:  90 PR Interval:    QRS Duration: 116 QT Interval:  461 QTC Calculation: 564 R Axis:   68 Text Interpretation:  Atrial fibrillation Osborn waves Nonspecific intraventricular conduction delay Borderline repolarization abnormality Reconfirmed by Kyle Bulson MD, Kyle Morrow [phone] on 04/03/2015 12:45:18 PM       I performed a history and physical examination of Kyle Morrow and discussed his management with Kyle Morrow.  I agree with the history, physical, assessment, and plan of care, with the following exceptions: None  I was present for the following procedures: None Time Spent in Critical Care of the patient: None Time spent in discussions with the patient and family: 20  Kyle Morrow  CRITICAL CARE Performed by: Kyle Morrow Total critical care time: 90 minutes Critical care time was exclusive of separately billable procedures and treating other patients. Critical care was necessary to treat or prevent imminent or life-threatening deterioration. Critical care was time spent personally by me on the following activities: development of treatment plan with patient and/or surrogate as well as nursing, discussions with consultants, evaluation of patient'Morrow response to treatment, examination of patient, obtaining history from patient or surrogate, ordering and performing treatments and interventions,  ordering and review of laboratory studies, ordering and review of radiographic studies, pulse oximetry and re-evaluation of patient'Morrow condition.   Kyle Grizzleanielle Kyle Villegas, MD 04/03/15 [phone]

## 2015-04-03 NOTE — ED Notes (Signed)
Foley temp  Reading 86.36

## 2015-04-03 NOTE — ED Notes (Signed)
CCM MD at bedside.  

## 2015-04-03 NOTE — ED Notes (Addendum)
Verbal order for pt to receive 6mg  Ativan d/t seizure activity, pt received 6mg  Ativan

## 2015-04-03 NOTE — ED Notes (Addendum)
Patient's temperature is being monitored via Temp Foley Catheter.  Core body temp is below threshold that EPIC will allow to be documented.  Current Temp - 30.8 C - 14:09  13:24 - (29.5 C) 13:46 - (30.2 C) 14:00 - (30.7 C)

## 2015-04-04 ENCOUNTER — Inpatient Hospital Stay (HOSPITAL_COMMUNITY): Payer: Medicare Other

## 2015-04-04 DIAGNOSIS — L8990 Pressure ulcer of unspecified site, unspecified stage: Secondary | ICD-10-CM

## 2015-04-04 DIAGNOSIS — M25521 Pain in right elbow: Secondary | ICD-10-CM | POA: Insufficient documentation

## 2015-04-04 DIAGNOSIS — J9601 Acute respiratory failure with hypoxia: Secondary | ICD-10-CM | POA: Insufficient documentation

## 2015-04-04 DIAGNOSIS — T68XXXA Hypothermia, initial encounter: Principal | ICD-10-CM

## 2015-04-04 DIAGNOSIS — R569 Unspecified convulsions: Secondary | ICD-10-CM

## 2015-04-04 LAB — COMPREHENSIVE METABOLIC PANEL
ALT: 30 U/L (ref 17–63)
ANION GAP: 5 (ref 5–15)
AST: 151 U/L — ABNORMAL HIGH (ref 15–41)
Albumin: 2.7 g/dL — ABNORMAL LOW (ref 3.5–5.0)
Alkaline Phosphatase: 42 U/L (ref 38–126)
BUN: 19 mg/dL (ref 6–20)
CHLORIDE: 118 mmol/L — AB (ref 101–111)
CO2: 20 mmol/L — AB (ref 22–32)
Calcium: 7.5 mg/dL — ABNORMAL LOW (ref 8.9–10.3)
Creatinine, Ser: 1.01 mg/dL (ref 0.61–1.24)
GFR calc non Af Amer: >60 mL/min (ref >60)
Glucose, Bld: 100 mg/dL — ABNORMAL HIGH (ref 65–99)
Potassium: 3.9 mmol/L (ref 3.5–5.1)
SODIUM: 143 mmol/L (ref 135–145)
Total Bilirubin: 1.1 mg/dL (ref 0.3–1.2)
Total Protein: 5 g/dL — ABNORMAL LOW (ref 6.5–8.1)

## 2015-04-04 LAB — URINE MICROSCOPIC-ADD ON

## 2015-04-04 LAB — CBC WITH DIFFERENTIAL/PLATELET
Basophils Absolute: 0 10*3/uL (ref 0.0–0.1)
Basophils Relative: 0 %
Eosinophils Absolute: 0 10*3/uL (ref 0.0–0.7)
Eosinophils Relative: 0 %
HEMATOCRIT: 33 % — AB (ref 39.0–52.0)
HEMOGLOBIN: 10.9 g/dL — AB (ref 13.0–17.0)
LYMPHS ABS: 1.3 10*3/uL (ref 0.7–4.0)
LYMPHS PCT: 13 %
MCH: 27.4 pg (ref 26.0–34.0)
MCHC: 33 g/dL (ref 30.0–36.0)
MCV: 82.9 fL (ref 78.0–100.0)
MONOS PCT: 8 %
Monocytes Absolute: 0.8 10*3/uL (ref 0.1–1.0)
NEUTROS PCT: 79 %
Neutro Abs: 8.4 10*3/uL — ABNORMAL HIGH (ref 1.7–7.7)
Platelets: 140 10*3/uL — ABNORMAL LOW (ref 150–400)
RBC: 3.98 MIL/uL — AB (ref 4.22–5.81)
RDW: 13.9 % (ref 11.5–15.5)
WBC: 10.6 10*3/uL — AB (ref 4.0–10.5)

## 2015-04-04 LAB — BLOOD GAS, ARTERIAL
Acid-base deficit: 5.4 mmol/L — ABNORMAL HIGH (ref 0.0–2.0)
Bicarbonate: 18.8 mEq/L — ABNORMAL LOW (ref 20.0–24.0)
DRAWN BY: 44956
FIO2: 0.4
MECHVT: 410 mL
O2 Saturation: 98.3 %
PEEP/CPAP: 5 cmH2O
Patient temperature: 102
RATE: 26 resp/min
TCO2: 19.8 mmol/L (ref 0–100)
pCO2 arterial: 35.4 mmHg (ref 35.0–45.0)
pH, Arterial: 7.355 (ref 7.350–7.450)
pO2, Arterial: 152 mmHg — ABNORMAL HIGH (ref 80.0–100.0)

## 2015-04-04 LAB — GLUCOSE, CAPILLARY
GLUCOSE-CAPILLARY: 93 mg/dL (ref 65–99)
Glucose-Capillary: 98 mg/dL (ref 65–99)
Glucose-Capillary: 96 mg/dL (ref 65–99)
Glucose-Capillary: 99 mg/dL (ref 65–99)
Glucose-Capillary: 102 mg/dL — ABNORMAL HIGH (ref 65–99)

## 2015-04-04 LAB — URINALYSIS, ROUTINE W REFLEX MICROSCOPIC
Bilirubin Urine: NEGATIVE
Glucose, UA: NEGATIVE mg/dL
KETONES UR: NEGATIVE mg/dL
NITRITE: NEGATIVE
PH: 5 (ref 5.0–8.0)
Protein, ur: NEGATIVE mg/dL
SPECIFIC GRAVITY, URINE: 1.016 (ref 1.005–1.030)

## 2015-04-04 LAB — POCT I-STAT 3, ART BLOOD GAS (G3+)
Acid-base deficit: 7 mmol/L — ABNORMAL HIGH (ref 0.0–2.0)
BICARBONATE: 18 meq/L — AB (ref 20.0–24.0)
O2 Saturation: 98 %
PCO2 ART: 34.1 mmHg — AB (ref 35.0–45.0)
PO2 ART: 119 mmHg — AB (ref 80.0–100.0)
TCO2: 19 mmol/L (ref 0–100)
pH, Arterial: 7.337 — ABNORMAL LOW (ref 7.350–7.450)

## 2015-04-04 LAB — LACTIC ACID, PLASMA: LACTIC ACID, VENOUS: 1.3 mmol/L (ref 0.5–2.0)

## 2015-04-04 LAB — TSH: TSH: 0.678 u[IU]/mL (ref 0.350–4.500)

## 2015-04-04 LAB — CORTISOL: Cortisol, Plasma: 16 ug/dL

## 2015-04-04 LAB — PROCALCITONIN: Procalcitonin: 2.13 ng/mL

## 2015-04-04 MED ORDER — ACETAMINOPHEN 160 MG/5ML PO SOLN
650.0000 mg | Freq: Four times a day (QID) | ORAL | Status: DC | PRN
  Administered 2015-04-04 – 2015-04-06 (×3): 650 mg
  Filled 2015-04-04 (×3): qty 20.3

## 2015-04-04 MED ORDER — MIDAZOLAM HCL 2 MG/2ML IJ SOLN
INTRAMUSCULAR | Status: AC
  Filled 2015-04-04: qty 2

## 2015-04-04 MED ORDER — HYDROCORTISONE NA SUCCINATE PF 100 MG IJ SOLR
50.0000 mg | Freq: Four times a day (QID) | INTRAMUSCULAR | Status: DC
  Administered 2015-04-04 – 2015-04-05 (×4): 50 mg via INTRAVENOUS
  Filled 2015-04-04 (×4): qty 2

## 2015-04-04 MED ORDER — CARBIDOPA-LEVODOPA 4.63-20 MG/ML EN SUSP
Freq: Every day | ENTERAL | Status: DC
  Administered 2015-04-04: 13:00:00 via ENTERAL
  Administered 2015-04-05: 3.4 mL/h via ENTERAL
  Administered 2015-04-06 – 2015-04-14 (×9): via ENTERAL
  Filled 2015-04-04: qty 100

## 2015-04-04 MED ORDER — MIDAZOLAM HCL 2 MG/2ML IJ SOLN
1.0000 mg | INTRAMUSCULAR | Status: DC | PRN
  Administered 2015-04-04 – 2015-04-05 (×3): 2 mg via INTRAVENOUS
  Filled 2015-04-04 (×2): qty 2

## 2015-04-04 MED ORDER — VANCOMYCIN HCL 10 G IV SOLR
1500.0000 mg | Freq: Once | INTRAVENOUS | Status: AC
  Administered 2015-04-04: 1500 mg via INTRAVENOUS
  Filled 2015-04-04: qty 1500

## 2015-04-04 MED ORDER — VITAL AF 1.2 CAL PO LIQD
1000.0000 mL | ORAL | Status: DC
  Administered 2015-04-04 – 2015-04-06 (×3): 1000 mL
  Filled 2015-04-04 (×4): qty 1000

## 2015-04-04 MED ORDER — SODIUM CHLORIDE 0.9 % IV BOLUS (SEPSIS)
1000.0000 mL | Freq: Once | INTRAVENOUS | Status: AC
  Administered 2015-04-04: 1000 mL via INTRAVENOUS

## 2015-04-04 MED ORDER — VANCOMYCIN HCL IN DEXTROSE 1-5 GM/200ML-% IV SOLN
1000.0000 mg | Freq: Two times a day (BID) | INTRAVENOUS | Status: DC
  Administered 2015-04-04 – 2015-04-06 (×4): 1000 mg via INTRAVENOUS
  Filled 2015-04-04 (×5): qty 200

## 2015-04-04 MED ORDER — PIPERACILLIN-TAZOBACTAM 3.375 G IVPB
3.3750 g | Freq: Three times a day (TID) | INTRAVENOUS | Status: DC
  Administered 2015-04-04 – 2015-04-06 (×7): 3.375 g via INTRAVENOUS
  Filled 2015-04-04 (×9): qty 50

## 2015-04-04 MED ORDER — VITAL HIGH PROTEIN PO LIQD: 1000.0000 mL | ORAL | Status: DC

## 2015-04-04 MED ORDER — SODIUM CHLORIDE 0.45 % IV SOLN
INTRAVENOUS | Status: DC
  Administered 2015-04-04: 10:00:00 via INTRAVENOUS

## 2015-04-04 NOTE — Care Management Note (Signed)
Case Management Note  Patient Details  Name: Rex KrasCharles Feely MRN: [id number] Date of Birth: 12/03/1940  Subjective/Objective:       Adm w hypotherimia, vent             Action/Plan: lives w wife, pcp dr Riley Nearingaguiar   Expected Discharge Date:                  Expected Discharge Plan:     In-House Referral:     Discharge planning Services     Post Acute Care Choice:    Choice offered to:     DME Arranged:    DME Agency:     HH Arranged:    HH Agency:     Status of Service:     Medicare Important Message Given:    Date Medicare IM Given:    Medicare IM give by:    Date Additional Medicare IM Given:    Additional Medicare Important Message give by:     If discussed at Long Length of Stay Meetings, dates discussed:    Additional Comments: ur review done  Hanley HaysDowell, Arias Weinert T, RN 04/04/2015, 8:39 AM

## 2015-04-04 NOTE — Progress Notes (Signed)
RT entered room to preform vent check and noted copious amount of frank red blood in subglottic suction line.  Upon further investigation it was discovered that the subglottic line was placed on continuous suction at full suction power.  RT disconnected subglottic and called RN.  RN to call  elink for further orders.  Subglottic to remain detached until further MD order.  Approx. 50 ml blood loss in canister.

## 2015-04-04 NOTE — Procedures (Signed)
ELECTROENCEPHALOGRAM REPORT  Patient: Kyle Morrow       Room #: 2H10 EEG No. ID: [id number] Age: 75 y.o.        Sex: male Referring Physician: Kendrick FriesMCQUAID, D Report Date:  04/04/2015        Interpreting Physician: Aline BrochureSTEWART,Croy R  History: Kyle Morrow is an 75 y.o. male with a history of Parkinson's disease and dementia admitted on 04/03/2015 after being found unresponsive. He was hypothermic and exhibiting seizure activity on arrival in the ED. He has remained unresponsive.  Indications for study:  Assess severity of encephalopathy; rule out seizure activity.  Technique: This is an 18 channel routine scalp EEG performed at the bedside with bipolar and monopolar montages arranged in accordance to the international 10/20 system of electrode placement.   Description: Patient was intubated and on mechanical ventilation. He was not on sedating medication time of this study. Predominant activity consisted of low to moderate amplitude diffuse mixed delta and theta activity as well as frequent higher amplitude somewhat triphasic appearing delta waves. Photic stimulation was not performed. No epileptiform discharges were recorded.  Interpretation: This EEG recording was abnormal with moderately severe generalized nonspecific continuous slowing of cerebral activity, which can be seen with metabolic and toxic encephalopathies as well as with degenerative CNS disorders. No evidence of seizure activity was demonstrated.   Venetia MaxonR Saleen Peden M.D. [facility] [phone]

## 2015-04-04 NOTE — Progress Notes (Addendum)
PULMONARY / CRITICAL CARE MEDICINE   Name: Rudra Hobbins MRN: [id number] DOB: 1940-09-18    ADMISSION DATE:  04/03/2015  REFERRING MD :    PRIMARY SERVICE: PCCM  CHIEF COMPLAINT:   Chief Complaint  Patient presents with  . Seizures    BRIEF PATIENT DESCRIPTION:  75 y.o.male with PMH of Parkinson's/dementia and dependent on family for ADL's. He uses a carbidopa-levodopa through PEG.  He was last seen at 2 AM and then was found out in the cold by family this morning. Brought to ER cold, core temp 23, seizing.   SIGNIFICANT EVENTS / STUDIES:  1/08 Admit with hypothermia 1/08 Intubated   LINES / TUBES: ETT 1/08>>> Left rad 1/8>>> PEG>>>  CULTURES: MRSA PCR 1/8>> NEG BCx 1/9>> Tracheal Cx 1/8>>  ANTIBIOTICS: Vanc 1/8>> Zosyn 1/8>>  SUBJECTIVE:  Pt sedated. Febrile overnight up to 101.8, and leukocytosis started vanc/zosyn and cultures  Hypotensive, on neo and NS, +4.9L since admission    VITAL SIGNS: Temp:  [90.5 F (32.5 C)-101.8 F (38.8 C)] 100.9 F (38.3 C) (01/09 0700) Pulse Rate:  [41-116] 69 (01/09 0700) Resp:  [0-31] 26 (01/09 0700) BP: (70-140)/(45-101) 125/66 mmHg (01/09 0316) SpO2:  [80 %-100 %] 100 % (01/09 0700) Arterial Line BP: (86-163)/(52-80) 105/57 mmHg (01/09 0700) FiO2 (%):  [40 %-100 %] 40 % (01/09 0700) Weight:  [84.1 kg (185 lb 6.5 oz)-86.183 kg (190 lb)] 85.2 kg (187 lb 13.3 oz) (01/09 0453) HEMODYNAMICS:   VENTILATOR SETTINGS: Vent Mode:  [-] PRVC FiO2 (%):  [40 %-100 %] 40 % Set Rate:  [12 bmp-26 bmp] 26 bmp Vt Set:  [410 mL-610 mL] 410 mL PEEP:  [5 cmH20] 5 cmH20 Plateau Pressure:  [14 cmH20-18 cmH20] 17 cmH20 INTAKE / OUTPUT: Intake/Output      01/08 0701 - 01/09 0700 01/09 0701 - 01/10 0700   I.V. (mL/kg) 5338.4 (62.7)    IV Piggyback 1050    Total Intake(mL/kg) 6388.4 (75)    Urine (mL/kg/hr) 1105    Total Output 1105     Net +5283.4            PHYSICAL EXAMINATION: General:  NAD, on vent  Neuro: arouses to  voice HEENT:  NCAT, EOMI, ETT Cardiovascular:  RRR, no M/R/G heard Lungs:  CTAB Abdomen:  +BS, soft, NT/ND Ext:  warm, dry; no edema  LABS:  PULMONARY  Recent Labs Lab 04/03/15 1349 04/03/15 1938 04/03/15 2146 04/03/15 2349 04/04/15 0345  PHART 7.321* 7.344* 7.299* 7.312* 7.355  PCO2ART 30.7* 32.6* 35.8 36.5 35.4  PO2ART 184.0* 190.0* 151.0* 140.0* 152*  HCO3 17.3* 18.4* 17.9* 18.4* 18.8*  TCO2 19 19 19 19  19.8  O2SAT 100.0 100.0 99.0 99.0 98.3    CBC  Recent Labs Lab 04/03/15 1246  HGB 12.7*  HCT 39.0  WBC 17.5*  PLT 203    COAGULATION No results for input(s): INR in the last 168 hours.  CARDIAC  No results for input(s): TROPONINI in the last 168 hours. No results for input(s): PROBNP in the last 168 hours.   CHEMISTRY  Recent Labs Lab 04/03/15 1246 04/03/15 1940  NA 143 144  K 3.5 3.8  CL 110 116*  CO2 16* 22  GLUCOSE 125* 89  BUN 29* 27*  CREATININE 1.15 1.06  CALCIUM 8.1* 7.8*  MG  --  2.1   Estimated Creatinine Clearance: 65.9 mL/min (by C-G formula based on Cr of 1.06).   LIVER  Recent Labs Lab 04/03/15 1247  AST 33  ALT 7*  ALKPHOS 55  BILITOT 0.6  PROT 5.8*  ALBUMIN 3.4*     INFECTIOUS  Recent Labs Lab 04/03/15 1244 04/03/15 2208 04/04/15 0157  LATICACIDVEN 8.25* 0.9 1.3     ENDOCRINE CBG (last 3)   Recent Labs  04/03/15 2346 04/04/15 0337 04/04/15 0759  GLUCAP 181* 98 96     IMAGING x48h  Dg Elbow Complete Right  04/04/2015  CLINICAL DATA:  Intubated sedated patient with history of Parkinson's disease EXAM: RIGHT ELBOW - COMPLETE 3+ VIEW COMPARISON:  None in PACs FINDINGS: AP and lateral portable views of the elbow reveal no acute fracture or dislocation. There is mild soft tissue swelling over the olecranon and there is a tiny olecranon spur. A tiny bony irregularity along the posterior superior aspect of the olecranon is present. No joint effusion is evident. The radial head is intact. The distal humerus  is unremarkable. IMPRESSION: Mild soft tissue swelling posteriorly may reflect olecranon bursitis with a small olecranon spur. A bony density along the extreme posterior superior aspect of the olecranon likely reflects an osteophyte though a tiny avulsion is not absolutely excluded. Correlation with any history of trauma is needed. The clinical history mentions a pressure ulcer although the side is not specified. Electronically Signed   By: David  SwazilandJordan M.D.   On: 04/04/2015 07:46   Ct Head Wo Contrast  04/03/2015  CLINICAL DATA:  75 year old male found down outside EXAM: CT HEAD WITHOUT CONTRAST CT CERVICAL SPINE WITHOUT CONTRAST TECHNIQUE: Multidetector CT imaging of the head and cervical spine was performed following the standard protocol without intravenous contrast. Multiplanar CT image reconstructions of the cervical spine were also generated. COMPARISON:  None. FINDINGS: CT HEAD FINDINGS Negative for acute intracranial hemorrhage, acute infarction, mass, mass effect, hydrocephalus or midline shift. Gray-white differentiation is preserved throughout. Mild cerebral cortical atrophy. Small well-defined low-attenuation focus in the right putaminal most consistent with a remote lacunar infarct. No focal scalp contusion or hematoma. Globes and orbits are symmetric and intact bilaterally. No focal calvarial fracture. Partial opacification of several scattered right-sided ethmoid air cells. Atherosclerotic calcifications in both cavernous carotid arteries. CT CERVICAL SPINE FINDINGS Moderate image degradation secondary to patient motion. No acute fracture, malalignment or prevertebral soft tissue swelling. Multilevel cervical spondylosis. Mild (2 mm) degenerative anterolisthesis of C5 on C6. Right greater than left facet arthropathy. The patient is intubated and a nasogastric tube are present. Unremarkable CT appearance of the thyroid gland. No acute soft tissue abnormality. The lung apices are unremarkable.  IMPRESSION: CT HEAD 1. No acute intracranial abnormality. 2. Cerebral cortical atrophy. 3. Remote right basal ganglia lacunar infarct. 4. Atherosclerotic vascular calcifications in the intracranial carotid arteries. CT CSPINE 1. No evidence of acute fracture or malalignment. 2. Multilevel cervical spondylosis including mild degenerative anterolisthesis of C5 on C6. 3. Right greater than left facet arthropathy. Electronically Signed   By: Malachy MoanHeath  McCullough M.D.   On: 04/03/2015 13:52   Ct Cervical Spine Wo Contrast  04/03/2015  CLINICAL DATA:  75 year old male found down outside EXAM: CT HEAD WITHOUT CONTRAST CT CERVICAL SPINE WITHOUT CONTRAST TECHNIQUE: Multidetector CT imaging of the head and cervical spine was performed following the standard protocol without intravenous contrast. Multiplanar CT image reconstructions of the cervical spine were also generated. COMPARISON:  None. FINDINGS: CT HEAD FINDINGS Negative for acute intracranial hemorrhage, acute infarction, mass, mass effect, hydrocephalus or midline shift. Gray-white differentiation is preserved throughout. Mild cerebral cortical atrophy. Small well-defined low-attenuation focus in the right putaminal most consistent with a  remote lacunar infarct. No focal scalp contusion or hematoma. Globes and orbits are symmetric and intact bilaterally. No focal calvarial fracture. Partial opacification of several scattered right-sided ethmoid air cells. Atherosclerotic calcifications in both cavernous carotid arteries. CT CERVICAL SPINE FINDINGS Moderate image degradation secondary to patient motion. No acute fracture, malalignment or prevertebral soft tissue swelling. Multilevel cervical spondylosis. Mild (2 mm) degenerative anterolisthesis of C5 on C6. Right greater than left facet arthropathy. The patient is intubated and a nasogastric tube are present. Unremarkable CT appearance of the thyroid gland. No acute soft tissue abnormality. The lung apices are  unremarkable. IMPRESSION: CT HEAD 1. No acute intracranial abnormality. 2. Cerebral cortical atrophy. 3. Remote right basal ganglia lacunar infarct. 4. Atherosclerotic vascular calcifications in the intracranial carotid arteries. CT CSPINE 1. No evidence of acute fracture or malalignment. 2. Multilevel cervical spondylosis including mild degenerative anterolisthesis of C5 on C6. 3. Right greater than left facet arthropathy. Electronically Signed   By: Malachy Moan M.D.   On: 04/03/2015 13:52   Dg Pelvis Portable  04/03/2015  CLINICAL DATA:  Seizure EXAM: PORTABLE PELVIS 1-2 VIEWS COMPARISON:  None FINDINGS: There is a left hip arthroplasty device. Moderate degenerative changes are noted involving the right hip. Rectal temperature probe noted. IMPRESSION: 1. No acute findings. Electronically Signed   By: Signa Kell M.D.   On: 04/03/2015 13:03   Dg Chest Port 1 View  04/04/2015  CLINICAL DATA:  Respiratory failure. EXAM: PORTABLE CHEST 1 VIEW COMPARISON:  04/03/2015 FINDINGS: New endotracheal tube and NG tube in stable position. Mediastinum hilar structures normal. Progressive bibasilar atelectasis and/or infiltrates. No pleural effusion or pneumothorax. IMPRESSION: 1. Endotracheal tube and NG tube in good anatomic position. 2. Progressive bibasilar atelectasis and/or infiltrates. Electronically Signed   By: Maisie Fus  Register   On: 04/04/2015 07:43   Dg Chest Portable 1 View  04/03/2015  CLINICAL DATA:  Seizure EXAM: PORTABLE CHEST 1 VIEW COMPARISON:  None FINDINGS: The ET tube tip is above the carina. There is a nasogastric tube with tip in the stomach. Normal heart size. No pleural effusion or edema. No airspace consolidation. IMPRESSION: 1. No acute cardiopulmonary abnormalities. 2. Satisfactory position of the support apparatus. Electronically Signed   By: Signa Kell M.D.   On: 04/03/2015 13:00    ASSESSMENT / PLAN:  PULMONARY A: Acute hypoxemic respiratory failure At risk  aspiration High MV P:   Cont full vent support  Daily SBT if/when meets criteria SBT no go until MV reduced Repeat abg   CARDIOVASCULAR A:  Hypotension secondary to hypothermia / sepsis? Bradycardia -resolved  Limited access P:  Cont neo (currently 15) to maintain MAP>65 mmHg Aline noted, dc as able consider cvp and line  RENAL A:   Hyperchloremia, no brain edema on ct P:   Monitor BMP Monitor I/Os Correct electrolytes as indicated Consider cvp Consider to 1/2 NS  GASTROINTESTINAL A:   No acute issues  P:   SUP: PPI Will need to start TFs Repeat LFt in am   HEMATOLOGIC A:   Anemia   P:  VTE px: SQ hep and SCDs Monitor CBC now Transfuse per usual ICU guidelines  INFECTIOUS A:   Leukocytosis  Febrile  Asp? P:  Check PCT, unsure how to interpret with prior low temp Pan cultures Micro and abx as above Zosyn continued  ENDOCRINE A:   No active issues  At risk pancreatic dysfxn P:   Monitor tsh x 1 cbg  NEUROLOGIC A:  PD Dementia /  parkinsons  P:   Will need to obtain records re: carbidopa  Daily WUA Continue to monitor NO haldol, Risperdal, antipsychotics!!   Boykin Peek, MD Internal Medicine, PGY-3   STAFF NOTE: I, Rory Percy, MD FACP have personally reviewed patient's available data, including medical history, events of note, physical examination and test results as part of my evaluation. I have discussed with resident/NP and other care providers such as pharmacist, RN and RRT. In addition, I personally evaluated patient and elicited key findings [id number] to voice, coarse BS, fever now , [facility] sent, covering aspiration, obtain ABG on current MV / rate, likley can redcue rate then sbt?, eeg now on going, corrected temp, neo to off as able, get cortisol, consider cvp and line, change fluids to 1/2 NS, CT no edema brain, reduce fludis rate, NO antipsychotics in setting parkinsons, will discuss code status, family updated by me in  full in room, follow cultures sent, he is at risk NMS alone without receiving his dop agonists, have asked family to bring in with pharm and administer  The patient is critically ill with multiple organ systems failure and requires high complexity decision making for assessment and support, frequent evaluation and titration of therapies, application of advanced monitoring technologies and extensive interpretation of multiple databases.   Critical Care Time devoted to patient care services described in this note is30 Minutes. This time reflects time of care of this signee: Rory Percy, MD FACP. This critical care time does not reflect procedure time, or teaching time or supervisory time of PA/NP/Med student/Med Resident etc but could involve care discussion time. Rest per NP/medical resident whose note is outlined above and that I agree with   Mcarthur Rossetti. Tyson Alias, MD, FACP Pgr: [phone] [facility] 04/04/2015 10:04 AM   I have had extensive discussions with family. We discussed patients current circumstances and organ failures. We also discussed patient's prior wishes under circumstances such as this. Family has decided to NOT perform resuscitation if arrest but to continue current medical support for now.  Mcarthur Rossetti. Tyson Alias, MD, FACP Pgr: [phone] [facility]

## 2015-04-04 NOTE — Progress Notes (Signed)
ANTIBIOTIC CONSULT NOTE - INITIAL  Pharmacy Consult for Vancomycin and Zosyn  Indication: rule out sepsis  No Known Allergies  Patient Measurements: Height: 5\' 8"  (172.7 cm) Weight: 185 lb 6.5 oz (84.1 kg) IBW/kg (Calculated) : 68.4  Vital Signs: Temp: 101.5 F (38.6 C) (01/09 0300) Temp Source: Core (Comment) (01/09 0300) BP: 125/66 mmHg (01/09 0316) Pulse Rate: 72 (01/09 0316) Intake/Output from previous day: 01/08 0701 - 01/09 0700 In: 5148 [I.V.:4648; IV Piggyback:500] Out: 650 [Urine:650] Intake/Output from this shift: Total I/O In: 1515.1 [I.V.:1515.1] Out: 300 [Urine:300]  Labs:  Recent Labs  04/03/15 1246 04/03/15 1940  WBC 17.5*  --   HGB 12.7*  --   PLT 203  --   CREATININE 1.15 1.06   Estimated Creatinine Clearance: 65.6 mL/min (by C-G formula based on Cr of 1.06). No results for input(s): VANCOTROUGH, VANCOPEAK, VANCORANDOM, GENTTROUGH, GENTPEAK, GENTRANDOM, TOBRATROUGH, TOBRAPEAK, TOBRARND, AMIKACINPEAK, AMIKACINTROU, AMIKACIN in the last 72 hours.   Microbiology: Recent Results (from the past 720 hour(s))  MRSA PCR Screening     Status: None   Collection Time: 04/03/15  5:49 PM  Result Value Ref Range Status   MRSA by PCR NEGATIVE NEGATIVE Final    Comment:        The GeneXpert MRSA Assay (FDA approved for NASAL specimens only), is one component of a comprehensive MRSA colonization surveillance program. It is not intended to diagnose MRSA infection nor to guide or monitor treatment for MRSA infections.     Medical History: Past Medical History  Diagnosis Date  . Parkinson disease (HCC)     Medications:  ASA  Sinemet  Aricept  Lexapro  Assessment: 75 y.o. male with new fevers, possible sepsis, for empiric antibiotics  Goal of Therapy:  Vancomycin trough level 15-20 mcg/ml  Plan:  Vancomycin 1500 mg IV now, then 1 g IV q12h Zosyn 3.375 g IV q8h   Sanna Porcaro, Gary FleetGregory Vernon 04/04/2015,3:41 AM

## 2015-04-04 NOTE — Procedures (Signed)
Central Venous Catheter Insertion Procedure Note Kyle Morrow [id number] 04/17/1940  Procedure: Insertion of Central Venous Catheter Indications: Assessment of intravascular volume, Drug and/or fluid administration and Frequent blood sampling  Procedure Details Consent: Risks of procedure as well as the alternatives and risks of each were explained to the (patient/caregiver).  Consent for procedure obtained. Time Out: Verified patient identification, verified procedure, site/side was marked, verified correct patient position, special equipment/implants available, medications/allergies/relevent history reviewed, required imaging and test results available.  Performed  Maximum sterile technique was used including antiseptics, cap, gloves, gown, hand hygiene, mask and sheet. Skin prep: Chlorhexidine; local anesthetic administered A antimicrobial bonded/coated triple lumen catheter was placed in the left internal jugular vein using the Seldinger technique.  Evaluation Blood flow good Complications: No apparent complications Patient did tolerate procedure well. Chest X-ray ordered to verify placement.  CXR: pending.  [name],[name] 04/04/2015, 2:14 PM   US  [name] [name], MD, FACP Pgr: [phone] [facility]

## 2015-04-04 NOTE — Progress Notes (Signed)
eLink Physician-Brief Progress Note Patient Name: Kyle KrasCharles Morrow DOB: 06/08/1940 MRN: [id number]   Date of Service  04/04/2015  HPI/Events of Note  Hypotension - Currently on Phenylephrine IV infusion.   eICU Interventions  Will order: 1. Bolus with 0.9 NaCl 1 liter IV over 1 hour now.  2. Wean Phenylephrine IV infusion as tolerated.      Intervention Category Intermediate Interventions: Hypotension - evaluation and management  Taria Castrillo Eugene 04/04/2015, 5:04 AM

## 2015-04-04 NOTE — Progress Notes (Signed)
Initial Nutrition Assessment   INTERVENTION:   Initiate Vital AF 1.2 @ 20 ml/hr via OG tube and increase by 10 ml every 4 hours to goal rate of 70 ml/hr.   Tube feeding regimen provides 2016 kcal (98% of needs), 216 grams of protein, and 1362 ml of H2O.   NUTRITION DIAGNOSIS:   Inadequate oral intake related to inability to eat as evidenced by NPO status.  GOAL:   Patient will meet greater than or equal to 90% of their needs  MONITOR:   TF tolerance, Vent status, Labs, Skin  REASON FOR ASSESSMENT:   Consult Enteral/tube feeding initiation and management  ASSESSMENT:   Pt with hx of Parkinsons's/dementia and dependent on family for ADL's, uses carbidopa-levodopa through PEG. Admitted after being found out in the cold by family with hypothermia and seizures.   Patient is currently intubated on ventilator support MV: 10.8 L/min Temp (24hrs), Avg:96.8 F (36 C), Min:90.5 F (32.5 C), Max:101.8 F (38.8 C)  OG tube in stomach. PEG in place.  Medications reviewed and include: levodopa Nutrition-Focused physical exam completed. Findings are no fat depletion, mild muscle depletion on temples, and no edema.   Nutrition hx unknown, no family present.   Diet Order:  Diet NPO time specified  Skin:  Wound (see comment) (Unstageable wounds to elbow and both hands)  Last BM:  unknown  Height:   Ht Readings from Last 1 Encounters:  04/03/15 5\' 8"  (1.727 m)    Weight:   Wt Readings from Last 1 Encounters:  04/04/15 187 lb 13.3 oz (85.2 kg)    Ideal Body Weight:  70 kg  BMI:  Body mass index is 28.57 kg/(m^2).  Estimated Nutritional Needs:   Kcal:  2115  Protein:  110-120 grams  Fluid:  > 2.1 L/day  EDUCATION NEEDS:   No education needs identified at this time  Kendell BaneHeather Milaya Hora RD, LDN, CNSC [phone] Pager [phone] After Hours Pager

## 2015-04-04 NOTE — Progress Notes (Signed)
eLink Physician-Brief Progress Note Patient Name: Kyle KrasCharles Cassell DOB: 10/05/1940 MRN: [id number]   Date of Service  04/04/2015  HPI/Events of Note  Fever to 101.5 F. WBC = 17.5.   eICU Interventions  Will order: 1. Blood Cultures X 2. 2. UA. 3. Tracheal Aspirate Culture. 4. Vancomycin and Zosyn per Pharmacy consult. 5. Tylenol Liquid 650 mg per tube Q 6 hours PRN Temp > 101.5 F.     Intervention Category Intermediate Interventions: Infection - evaluation and management  Sommer,Steven Eugene 04/04/2015, 3:42 AM

## 2015-04-04 NOTE — Progress Notes (Signed)
eLink Physician-Brief Progress Note Patient Name: Rex KrasCharles Wands DOB: 01/23/1941 MRN: [id number]   Date of Service  04/04/2015  HPI/Events of Note  Bites et tube  eICU Interventions  Dc etomiate prn Dc succinylcholine prn Start versed prn     Intervention Category Major Interventions: Other:  Halimah Bewick 04/04/2015, 10:08 PM

## 2015-04-04 NOTE — Progress Notes (Signed)
EEG Completed; Results Pending  

## 2015-04-04 NOTE — Progress Notes (Signed)
No vent wean d/t pt new pt from yesterday, waiting on MD to round for vent plans.

## 2015-04-05 ENCOUNTER — Inpatient Hospital Stay (HOSPITAL_COMMUNITY): Payer: Medicare Other

## 2015-04-05 LAB — COMPREHENSIVE METABOLIC PANEL
ALK PHOS: 43 U/L (ref 38–126)
ALT: 9 U/L — AB (ref 17–63)
AST: 127 U/L — ABNORMAL HIGH (ref 15–41)
Albumin: 2.7 g/dL — ABNORMAL LOW (ref 3.5–5.0)
Anion gap: 9 (ref 5–15)
BILIRUBIN TOTAL: 1.3 mg/dL — AB (ref 0.3–1.2)
BUN: 15 mg/dL (ref 6–20)
CALCIUM: 7.7 mg/dL — AB (ref 8.9–10.3)
CO2: 21 mmol/L — AB (ref 22–32)
CREATININE: 0.83 mg/dL (ref 0.61–1.24)
Chloride: 111 mmol/L (ref 101–111)
Glucose, Bld: 147 mg/dL — ABNORMAL HIGH (ref 65–99)
Potassium: 3.6 mmol/L (ref 3.5–5.1)
Sodium: 141 mmol/L (ref 135–145)
TOTAL PROTEIN: 5 g/dL — AB (ref 6.5–8.1)

## 2015-04-05 LAB — BLOOD GAS, ARTERIAL
ACID-BASE DEFICIT: 3.7 mmol/L — AB (ref 0.0–2.0)
BICARBONATE: 19.5 meq/L — AB (ref 20.0–24.0)
Drawn by: 44956
FIO2: 0.4
LHR: 26 {breaths}/min
MECHVT: 410 mL
O2 SAT: 98.7 %
PATIENT TEMPERATURE: 99.5
PEEP/CPAP: 5 cmH2O
TCO2: 20.4 mmol/L (ref 0–100)
pCO2 arterial: 28.4 mmHg — ABNORMAL LOW (ref 35.0–45.0)
pH, Arterial: 7.454 — ABNORMAL HIGH (ref 7.350–7.450)
pO2, Arterial: 154 mmHg — ABNORMAL HIGH (ref 80.0–100.0)

## 2015-04-05 LAB — PROTIME-INR
INR: 1.3 (ref 0.00–1.49)
Prothrombin Time: 16.3 seconds — ABNORMAL HIGH (ref 11.6–15.2)

## 2015-04-05 LAB — GLUCOSE, CAPILLARY
GLUCOSE-CAPILLARY: 103 mg/dL — AB (ref 65–99)
GLUCOSE-CAPILLARY: 141 mg/dL — AB (ref 65–99)
GLUCOSE-CAPILLARY: 150 mg/dL — AB (ref 65–99)
GLUCOSE-CAPILLARY: 127 mg/dL — AB (ref 65–99)
Glucose-Capillary: 100 mg/dL — ABNORMAL HIGH (ref 65–99)
Glucose-Capillary: 141 mg/dL — ABNORMAL HIGH (ref 65–99)

## 2015-04-05 LAB — CBC WITH DIFFERENTIAL/PLATELET
BASOS PCT: 0 %
Basophils Absolute: 0 10*3/uL (ref 0.0–0.1)
EOS ABS: 0 10*3/uL (ref 0.0–0.7)
Eosinophils Relative: 0 %
HCT: 36.7 % — ABNORMAL LOW (ref 39.0–52.0)
HEMOGLOBIN: 12.2 g/dL — AB (ref 13.0–17.0)
Lymphocytes Relative: 13 %
Lymphs Abs: 1.4 10*3/uL (ref 0.7–4.0)
MCH: 27.9 pg (ref 26.0–34.0)
MCHC: 33.2 g/dL (ref 30.0–36.0)
MCV: 83.8 fL (ref 78.0–100.0)
MONOS PCT: 5 %
Monocytes Absolute: 0.5 10*3/uL (ref 0.1–1.0)
NEUTROS PCT: 82 %
Neutro Abs: 8.5 10*3/uL — ABNORMAL HIGH (ref 1.7–7.7)
Platelets: 119 10*3/uL — ABNORMAL LOW (ref 150–400)
RBC: 4.38 MIL/uL (ref 4.22–5.81)
RDW: 14.1 % (ref 11.5–15.5)
WBC: 10.4 10*3/uL (ref 4.0–10.5)

## 2015-04-05 LAB — TRIGLYCERIDES: Triglycerides: 83 mg/dL (ref <150)

## 2015-04-05 LAB — APTT: aPTT: 33 seconds (ref 24–37)

## 2015-04-05 MED ORDER — ASPIRIN 325 MG PO TABS: 325.0000 mg | ORAL_TABLET | Freq: Every day | ORAL | Status: DC

## 2015-04-05 MED ORDER — HYDRALAZINE HCL 20 MG/ML IJ SOLN
10.0000 mg | INTRAMUSCULAR | Status: DC | PRN
  Administered 2015-04-06 – 2015-04-08 (×3): 10 mg via INTRAVENOUS
  Filled 2015-04-05 (×3): qty 1

## 2015-04-05 MED ORDER — ASPIRIN 325 MG PO TABS
325.0000 mg | ORAL_TABLET | Freq: Every day | ORAL | Status: DC
  Administered 2015-04-05 – 2015-04-07 (×3): 325 mg
  Filled 2015-04-05 (×3): qty 1

## 2015-04-05 MED ORDER — HYDRALAZINE HCL 25 MG PO TABS
25.0000 mg | ORAL_TABLET | Freq: Three times a day (TID) | ORAL | Status: DC
  Administered 2015-04-05 (×2): 25 mg
  Filled 2015-04-05 (×3): qty 1

## 2015-04-05 MED ORDER — PROPOFOL 1000 MG/100ML IV EMUL
0.0000 ug/kg/min | INTRAVENOUS | Status: DC
  Administered 2015-04-05 – 2015-04-06 (×2): 5 ug/kg/min via INTRAVENOUS
  Administered 2015-04-06: 19.961 ug/kg/min via INTRAVENOUS
  Administered 2015-04-06: 5 ug/kg/min via INTRAVENOUS
  Administered 2015-04-07: 10 ug/kg/min via INTRAVENOUS
  Filled 2015-04-05 (×5): qty 100

## 2015-04-05 NOTE — Progress Notes (Signed)
Pt personal medication pump alarm showing occlusion. While investigating pump, pt's family asked about medication canister. Family asked if canister was new or if it was being reused. Pharmacy was contacted and indicated that the amount of medication canisters that were originally accounted for still remained in stock. The canister that was obtained from pharmacy earlier on 1/10 had been dispensed from pharmacy and they indicated it had been refrigerated along with the new canisters. The empty canister was discarded and a new canister was obtained and attached to the pump. Instructions highlighting canister one time use were placed in pt's room.

## 2015-04-05 NOTE — Progress Notes (Signed)
PULMONARY / CRITICAL CARE MEDICINE   Name: Kyle Morrow MRN: [id number] DOB: [birth year]    ADMISSION DATE:  04/03/2015  REFERRING MD :    PRIMARY SERVICE: PCCM  CHIEF COMPLAINT:   Chief Complaint  Patient presents with  . Seizures    BRIEF PATIENT DESCRIPTION:  75 y.o.male with PMH of Parkinson's/dementia and dependent on family for ADL's. He uses a carbidopa-levodopa through PEG.  He was last seen at 2 AM and then was found out in the cold by family this morning. Brought to ER cold, core temp 23, seizing.   SIGNIFICANT EVENTS / STUDIES:  1/08 Admit with hypothermia 1/08 Intubated 1/09 EEG: Abn with moderately severe generalized continuous slowing of cerebral activity seen in metabolic/toxic/degenerative CNS disorders     LINES / TUBES: ETT 1/08>>> Left rad 1/8>>>1/10 [facility]J 1/9>> PEG>>>  CULTURES: MRSA PCR 1/8>> NEG BCx 1/9>> Tracheal Cx 1/8>>  ANTIBIOTICS: Vanc 1/8>> Zosyn 1/8>>  SUBJECTIVE:  Pt sedated Biting at ETT overnight, versed PRN, d/c etomidate, succinylcholine    VITAL SIGNS: Temp:  [99.5 F (37.5 C)-101.8 F (38.8 C)] 99.5 F (37.5 C) (01/10 0500) Pulse Rate:  [69-95] 95 (01/10 0500) Resp:  [5-27] 25 (01/10 0500) BP: (125-159)/(60-73) 159/73 mmHg (01/09 2007) SpO2:  [99 %-100 %] 100 % (01/10 0500) Arterial Line BP: (105-182)/(57-95) 166/95 mmHg (01/10 0500) FiO2 (%):  [40 %] 40 % (01/10 0400) Weight:  [86 kg (189 lb 9.5 oz)] 86 kg (189 lb 9.5 oz) (01/10 0500) HEMODYNAMICS:   VENTILATOR SETTINGS: Vent Mode:  [-] PRVC FiO2 (%):  [40 %] 40 % Set Rate:  [26 bmp] 26 bmp Vt Set:  [410 mL] 410 mL PEEP:  [5 cmH20] 5 cmH20 Plateau Pressure:  [13 cmH20-15 cmH20] 14 cmH20 INTAKE / OUTPUT: Intake/Output      01/09 0701 - 01/10 0700   I.V. (mL/kg) 869.1 (10.1)   NG/GT 440   IV Piggyback 550   Total Intake(mL/kg) 1859.1 (21.6)   Urine (mL/kg/hr) 1445 (0.7)   Total Output 1445   Net +414.1         PHYSICAL EXAMINATION: General:  NAD, on vent   Neuro: arouses to voice, purposeful HEENT:  NCAT, EOMI, ETT Cardiovascular:  RRR, no M/R/G heard Lungs:  ronchi rt base Abdomen:  +BS, soft, NT/ND Ext:  warm, dry; no edema  LABS:  PULMONARY  Recent Labs Lab 04/03/15 2146 04/03/15 2349 04/04/15 0345 04/04/15 1021 04/05/15 0403  PHART 7.299* 7.312* 7.355 7.337* 7.454*  PCO2ART 35.8 36.5 35.4 34.1* 28.4*  PO2ART 151.0* 140.0* 152* 119.0* 154*  HCO3 17.9* 18.4* 18.8* 18.0* 19.5*  TCO2 19 19 19.8 19 20.4  O2SAT 99.0 99.0 98.3 98.0 98.7    CBC  Recent Labs Lab 04/03/15 1246 04/04/15 1023 04/05/15 0515  HGB 12.7* 10.9* 12.2*  HCT 39.0 33.0* 36.7*  WBC 17.5* 10.6* 10.4  PLT 203 140* 119*    COAGULATION No results for input(s): INR in the last 168 hours.  CARDIAC  No results for input(s): TROPONINI in the last 168 hours. No results for input(s): PROBNP in the last 168 hours.   CHEMISTRY  Recent Labs Lab 04/03/15 1246 04/03/15 1940 04/04/15 1023 04/05/15 0515  NA 143 144 143 141  K 3.5 3.8 3.9 3.6  CL 110 116* 118* 111  CO2 16* 22 20* 21*  GLUCOSE 125* 89 100* 147*  BUN 29* 27* 19 15  CREATININE 1.15 1.06 1.01 0.83  CALCIUM 8.1* 7.8* 7.5* 7.7*  MG  --  2.1  --   --  Estimated Creatinine Clearance: 84.5 mL/min (by C-G formula based on Cr of 0.83).   LIVER  Recent Labs Lab 04/03/15 1247 04/04/15 1023 04/05/15 0515  AST 33 151* 127*  ALT 7* 30 9*  ALKPHOS 55 42 43  BILITOT 0.6 1.1 1.3*  PROT 5.8* 5.0* 5.0*  ALBUMIN 3.4* 2.7* 2.7*     INFECTIOUS  Recent Labs Lab 04/03/15 1244 04/03/15 2208 04/04/15 0157 04/04/15 0924  LATICACIDVEN 8.25* 0.9 1.3  --   PROCALCITON  --   --   --  2.13     ENDOCRINE CBG (last 3)   Recent Labs  04/04/15 1327 04/04/15 1639 04/04/15 2015  GLUCAP 99 102* 93     IMAGING x48h  Dg Elbow Complete Right  04/04/2015  CLINICAL DATA:  Intubated sedated patient with history of Parkinson's disease EXAM: RIGHT ELBOW - COMPLETE 3+ VIEW COMPARISON:   None in PACs FINDINGS: AP and lateral portable views of the elbow reveal no acute fracture or dislocation. There is mild soft tissue swelling over the olecranon and there is a tiny olecranon spur. A tiny bony irregularity along the posterior superior aspect of the olecranon is present. No joint effusion is evident. The radial head is intact. The distal humerus is unremarkable. IMPRESSION: Mild soft tissue swelling posteriorly may reflect olecranon bursitis with a small olecranon spur. A bony density along the extreme posterior superior aspect of the olecranon likely reflects an osteophyte though a tiny avulsion is not absolutely excluded. Correlation with any history of trauma is needed. The clinical history mentions a pressure ulcer although the side is not specified. Electronically Signed   By: David  [place] M.D.   On: 04/04/2015 07:46   Ct Head Wo Contrast  04/03/2015  CLINICAL DATA:  75 year old male found down outside EXAM: CT HEAD WITHOUT CONTRAST CT CERVICAL SPINE WITHOUT CONTRAST TECHNIQUE: Multidetector CT imaging of the head and cervical spine was performed following the standard protocol without intravenous contrast. Multiplanar CT image reconstructions of the cervical spine were also generated. COMPARISON:  None. FINDINGS: CT HEAD FINDINGS Negative for acute intracranial hemorrhage, acute infarction, mass, mass effect, hydrocephalus or midline shift. Gray-white differentiation is preserved throughout. Mild cerebral cortical atrophy. Small well-defined low-attenuation focus in the right putaminal most consistent with a remote lacunar infarct. No focal scalp contusion or hematoma. Globes and orbits are symmetric and intact bilaterally. No focal calvarial fracture. Partial opacification of several scattered right-sided ethmoid air cells. Atherosclerotic calcifications in both cavernous carotid arteries. CT CERVICAL SPINE FINDINGS Moderate image degradation secondary to patient motion. No acute fracture,  malalignment or prevertebral soft tissue swelling. Multilevel cervical spondylosis. Mild (2 mm) degenerative anterolisthesis of C5 on C6. Right greater than left facet arthropathy. The patient is intubated and a nasogastric tube are present. Unremarkable CT appearance of the thyroid gland. No acute soft tissue abnormality. The lung apices are unremarkable. IMPRESSION: CT HEAD 1. No acute intracranial abnormality. 2. Cerebral cortical atrophy. 3. Remote right basal ganglia lacunar infarct. 4. Atherosclerotic vascular calcifications in the intracranial carotid arteries. CT CSPINE 1. No evidence of acute fracture or malalignment. 2. Multilevel cervical spondylosis including mild degenerative anterolisthesis of C5 on C6. 3. Right greater than left facet arthropathy. Electronically Signed   By: Malachy Moan M.D.   On: 04/03/2015 13:52   Ct Cervical Spine Wo Contrast  04/03/2015  CLINICAL DATA:  75 year old male found down outside EXAM: CT HEAD WITHOUT CONTRAST CT CERVICAL SPINE WITHOUT CONTRAST TECHNIQUE: Multidetector CT imaging of the head and cervical spine  was performed following the standard protocol without intravenous contrast. Multiplanar CT image reconstructions of the cervical spine were also generated. COMPARISON:  None. FINDINGS: CT HEAD FINDINGS Negative for acute intracranial hemorrhage, acute infarction, mass, mass effect, hydrocephalus or midline shift. Gray-white differentiation is preserved throughout. Mild cerebral cortical atrophy. Small well-defined low-attenuation focus in the right putaminal most consistent with a remote lacunar infarct. No focal scalp contusion or hematoma. Globes and orbits are symmetric and intact bilaterally. No focal calvarial fracture. Partial opacification of several scattered right-sided ethmoid air cells. Atherosclerotic calcifications in both cavernous carotid arteries. CT CERVICAL SPINE FINDINGS Moderate image degradation secondary to patient motion. No acute  fracture, malalignment or prevertebral soft tissue swelling. Multilevel cervical spondylosis. Mild (2 mm) degenerative anterolisthesis of C5 on C6. Right greater than left facet arthropathy. The patient is intubated and a nasogastric tube are present. Unremarkable CT appearance of the thyroid gland. No acute soft tissue abnormality. The lung apices are unremarkable. IMPRESSION: CT HEAD 1. No acute intracranial abnormality. 2. Cerebral cortical atrophy. 3. Remote right basal ganglia lacunar infarct. 4. Atherosclerotic vascular calcifications in the intracranial carotid arteries. CT CSPINE 1. No evidence of acute fracture or malalignment. 2. Multilevel cervical spondylosis including mild degenerative anterolisthesis of C5 on C6. 3. Right greater than left facet arthropathy. Electronically Signed   By: Malachy Moan M.D.   On: 04/03/2015 13:52   Dg Pelvis Portable  04/03/2015  CLINICAL DATA:  Seizure EXAM: PORTABLE PELVIS 1-2 VIEWS COMPARISON:  None FINDINGS: There is a left hip arthroplasty device. Moderate degenerative changes are noted involving the right hip. Rectal temperature probe noted. IMPRESSION: 1. No acute findings. Electronically Signed   By: Signa Kell M.D.   On: 04/03/2015 13:03   Dg Chest Port 1 View  04/04/2015  CLINICAL DATA:  Hypoxia. Recent seizure. Central catheter placement EXAM: PORTABLE CHEST 1 VIEW COMPARISON:  Study obtained earlier in the day FINDINGS: Endotracheal tube tip is 6.6 cm above the carina. Central catheter tip is in the superior vena cava. Nasogastric tube tip and side port are in the stomach. No pneumothorax. There are areas of patchy consolidation in the right medial right base and left lower lobe regions. No new opacity. Heart is upper normal in size with pulmonary vascularity within normal limits. No adenopathy. No bone lesions. IMPRESSION: Stable patchy areas of consolidation bilaterally. No new opacity. Tube and catheter positions as described without  pneumothorax. Electronically Signed   By: Bretta Bang III M.D.   On: 04/04/2015 14:59   Dg Chest Port 1 View  04/04/2015  CLINICAL DATA:  Respiratory failure. EXAM: PORTABLE CHEST 1 VIEW COMPARISON:  04/03/2015 FINDINGS: New endotracheal tube and NG tube in stable position. Mediastinum hilar structures normal. Progressive bibasilar atelectasis and/or infiltrates. No pleural effusion or pneumothorax. IMPRESSION: 1. Endotracheal tube and NG tube in good anatomic position. 2. Progressive bibasilar atelectasis and/or infiltrates. Electronically Signed   By: Maisie Fus  Register   On: 04/04/2015 07:43   Dg Chest Portable 1 View  04/03/2015  CLINICAL DATA:  Seizure EXAM: PORTABLE CHEST 1 VIEW COMPARISON:  None FINDINGS: The ET tube tip is above the carina. There is a nasogastric tube with tip in the stomach. Normal heart size. No pleural effusion or edema. No airspace consolidation. IMPRESSION: 1. No acute cardiopulmonary abnormalities. 2. Satisfactory position of the support apparatus. Electronically Signed   By: Signa Kell M.D.   On: 04/03/2015 13:00    ASSESSMENT / PLAN:  PULMONARY A: Acute hypoxemic respiratory failure At  risk aspiration P:   Cont full vent support  Daily SBT if/when meets criteria SBT no go until MV reduced abg reviewed, rate redcution  CARDIOVASCULAR A:  Hypotension secondary to hypothermia / sepsis? - resolved Bradycardia - resolved  HTN P:  Aline noted, dc as able today Dc neo off mar Add hydralazine (some brady) Add IV also prn  RENAL A:   Hyperchloremia - resolving P:   Monitor BMP Monitor I/Os Correct electrolytes as indicated Avoiding saline  GASTROINTESTINAL A:   Elevated transaminases - trending down slowly  P:   SUP: PPI TFs  HEMATOLOGIC A:   Anemia  Mild thrombocytopenia   P:  VTE px: SQ hep and SCDs Monitor CBC now Transfuse per usual ICU guidelines Obtain coags in am   INFECTIOUS A:   Leukocytosis  Febrile  Asp PNA likely  rt base P:  Pan cultures Micro and abx as above Zosyn continued If in am no mrsa, dc vanc  ENDOCRINE A:   Relative AI  At risk pancreatic dysfxn P:   hydrocortisone dc as htn  cbg  NEUROLOGIC A:  PD Dementia / parkinsons  P:   Will need to obtain records re: carbidopa , son administering with pharmacy his meds Daily WUA Continue to monitor NO haldol, Risperdal, antipsychotics!!  Boykin PeekJacquelyn Gill, MD Internal Medicine, PGY-3 04/05/2015 6:36 AM   STAFF NOTE: Cindi CarbonI, Marchell Froman, MD FACP have personally reviewed patient's available data, including medical history, events of note, physical examination and test results as part of my evaluation. I have discussed with resident/NP and other care providers such as pharmacist, RN and RRT. In addition, I personally evaluated patient and elicited key findings of: more awake, ronchi rt base, HTN now, pcxr with rt base infiltrate, maintain abx and narrow in am if remains culture neg, so important that he receives his home carbi dopa on vent, weaning today failed, escalate PS further to 20 if needed to have SBT, repeat pcxr in am , Tf to goal, avoiding saline, cl reduced, repeat chem and lft in am , i update family at bedside, control BP better, add hydral with HR 70, consider use propofol if needed and dc versed with mild dementia, to goal fent low dose, eeg neg The patient is critically ill with multiple organ systems failure and requires high complexity decision making for assessment and support, frequent evaluation and titration of therapies, application of advanced monitoring technologies and extensive interpretation of multiple databases.   Critical Care Time devoted to patient care services described in this note is30 Minutes. This time reflects time of care of this signee: Rory Percyaniel Tatem Fesler, MD FACP. This critical care time does not reflect procedure time, or teaching time or supervisory time of PA/NP/Med student/Med Resident etc but could  involve care discussion time. Rest per NP/medical resident whose note is outlined above and that I agree with   [name] [name], MD, FACP Pgr: [phone] [facility] 04/05/2015 10:16 AM

## 2015-04-05 NOTE — Progress Notes (Signed)
Did not preform SBT d/t high RR on rest mode (34-36), minute volume >15 lpm.  Pt appears slightly agitated this morning. Sat 100%. No distress noted.

## 2015-04-06 ENCOUNTER — Inpatient Hospital Stay (HOSPITAL_COMMUNITY): Payer: Medicare Other

## 2015-04-06 LAB — GLUCOSE, CAPILLARY
GLUCOSE-CAPILLARY: 120 mg/dL — AB (ref 65–99)
GLUCOSE-CAPILLARY: 131 mg/dL — AB (ref 65–99)
GLUCOSE-CAPILLARY: 137 mg/dL — AB (ref 65–99)
Glucose-Capillary: 120 mg/dL — ABNORMAL HIGH (ref 65–99)
Glucose-Capillary: 125 mg/dL — ABNORMAL HIGH (ref 65–99)
Glucose-Capillary: 118 mg/dL — ABNORMAL HIGH (ref 65–99)

## 2015-04-06 LAB — CBC WITH DIFFERENTIAL/PLATELET
Basophils Absolute: 0 10*3/uL (ref 0.0–0.1)
Basophils Relative: 0 %
Eosinophils Absolute: 0.1 10*3/uL (ref 0.0–0.7)
Eosinophils Relative: 1 %
HCT: 34.3 % — ABNORMAL LOW (ref 39.0–52.0)
HEMOGLOBIN: 11.5 g/dL — AB (ref 13.0–17.0)
LYMPHS PCT: 20 %
Lymphs Abs: 1.8 10*3/uL (ref 0.7–4.0)
MCH: 28.2 pg (ref 26.0–34.0)
MCHC: 33.5 g/dL (ref 30.0–36.0)
MCV: 84.1 fL (ref 78.0–100.0)
MONO ABS: 0.6 10*3/uL (ref 0.1–1.0)
MONOS PCT: 7 %
NEUTROS ABS: 6.3 10*3/uL (ref 1.7–7.7)
Neutrophils Relative %: 72 %
Platelets: 129 10*3/uL — ABNORMAL LOW (ref 150–400)
RBC: 4.08 MIL/uL — ABNORMAL LOW (ref 4.22–5.81)
RDW: 13.9 % (ref 11.5–15.5)
WBC: 8.8 10*3/uL (ref 4.0–10.5)

## 2015-04-06 LAB — COMPREHENSIVE METABOLIC PANEL
ALBUMIN: 2.6 g/dL — AB (ref 3.5–5.0)
ALK PHOS: 42 U/L (ref 38–126)
ALT: 14 U/L — ABNORMAL LOW (ref 17–63)
ANION GAP: 6 (ref 5–15)
AST: 79 U/L — ABNORMAL HIGH (ref 15–41)
BUN: 14 mg/dL (ref 6–20)
CO2: 27 mmol/L (ref 22–32)
Calcium: 8.2 mg/dL — ABNORMAL LOW (ref 8.9–10.3)
Chloride: 111 mmol/L (ref 101–111)
Creatinine, Ser: 0.82 mg/dL (ref 0.61–1.24)
GFR calc Af Amer: >60 mL/min (ref >60)
GFR calc non Af Amer: >60 mL/min (ref >60)
GLUCOSE: 124 mg/dL — AB (ref 65–99)
POTASSIUM: 3.3 mmol/L — AB (ref 3.5–5.1)
SODIUM: 144 mmol/L (ref 135–145)
Total Bilirubin: 1 mg/dL (ref 0.3–1.2)
Total Protein: 5 g/dL — ABNORMAL LOW (ref 6.5–8.1)

## 2015-04-06 LAB — CULTURE, RESPIRATORY

## 2015-04-06 LAB — MAGNESIUM: MAGNESIUM: 2 mg/dL (ref 1.7–2.4)

## 2015-04-06 LAB — CULTURE, RESPIRATORY W GRAM STAIN: Culture: NORMAL

## 2015-04-06 LAB — PHOSPHORUS: Phosphorus: 1.7 mg/dL — ABNORMAL LOW (ref 2.5–4.6)

## 2015-04-06 MED ORDER — SODIUM CHLORIDE 0.9 % IV SOLN
3.0000 g | Freq: Four times a day (QID) | INTRAVENOUS | Status: AC
  Administered 2015-04-06 – 2015-04-10 (×18): 3 g via INTRAVENOUS
  Filled 2015-04-06 (×21): qty 3

## 2015-04-06 MED ORDER — POTASSIUM PHOSPHATES 15 MMOLE/5ML IV SOLN
20.0000 mmol | Freq: Once | INTRAVENOUS | Status: AC
  Administered 2015-04-06: 20 mmol via INTRAVENOUS
  Filled 2015-04-06: qty 6.67

## 2015-04-06 MED ORDER — POTASSIUM CHLORIDE 20 MEQ/15ML (10%) PO SOLN
20.0000 meq | ORAL | Status: AC
  Administered 2015-04-06 (×2): 20 meq
  Filled 2015-04-06 (×2): qty 15

## 2015-04-06 MED ORDER — POLYETHYLENE GLYCOL 3350 17 G PO PACK
17.0000 g | PACK | Freq: Every day | ORAL | Status: DC
  Administered 2015-04-06 – 2015-04-13 (×4): 17 g via ORAL
  Filled 2015-04-06 (×8): qty 1

## 2015-04-06 MED ORDER — HYDRALAZINE HCL 50 MG PO TABS
50.0000 mg | ORAL_TABLET | Freq: Three times a day (TID) | ORAL | Status: DC
  Administered 2015-04-06 – 2015-04-07 (×3): 50 mg
  Filled 2015-04-06 (×3): qty 1

## 2015-04-06 NOTE — Progress Notes (Signed)
[facility] FOR AM LAB REPLACEMENT ONLY  The patient does apply for the [facility] Electrolyte [facility] Protocol based on the criteria listed below:   1. Is GFR >/= 40 ml/min? Yes.    Patient's GFR today is >60 2. Is urine output >/= 0.5 ml/kg/hr for the last 6 hours? Yes.   Patient's UOP is 0.5 ml/kg/hr 3. Is BUN < 60 mg/dL? Yes.    Patient's BUN today is 14 4. Abnormal electrolyte(s):  K 3.3 5. Ordered repletion with: per protocol 6. If a panic level lab has been reported, has the CCM MD in charge been notified? Yes.  .   Physician:  Bradd BurnerMannam  Mikhi Athey McEachran 04/06/2015 4:57 AM

## 2015-04-06 NOTE — Progress Notes (Signed)
Pharmacy Antibiotic Follow-up Note  Kyle Morrow is a 75 y.o. year-old male admitted on 04/03/2015.  The patient is currently on day 3 of vancomycin and Zosyn for potential aspiration pneumonia. Pt was found down out in cold by family and began having fevers after rewarming.  Assessment/Plan: After discussion with Dr. Tyson [name], the current antibiotic(s) vancomycin and Zosyn will be narrowed to Unasyn  and continued for 7 total days.  The stop date is entered and will be 04/10/15.  Temp (24hrs), Avg:99.9 F (37.7 C), Min:99.3 F (37.4 C), Max:100.2 F (37.9 C)   Recent Labs Lab 04/03/15 1246 04/04/15 1023 04/05/15 0515 04/06/15 0231  WBC 17.5* 10.6* 10.4 8.8    Recent Labs Lab 04/03/15 1246 04/03/15 1940 04/04/15 1023 04/05/15 0515 04/06/15 0231  CREATININE 1.15 1.06 1.01 0.83 0.82   Estimated Creatinine Clearance: 85.9 mL/min (by C-G formula based on Cr of 0.82).    No Known Allergies  Antimicrobials this admission: 1/9 vancomycin > 1/11 1/9 Zosyn > 1/11 1/11 Unasyn >>   Levels/dose changes this admission: none  Microbiology results: 1/9 BCx x2 > NG x1D 1/9 Sputum > mod G+ cocci in pairs on stain 1/8 MRSA PCR neg  Thank you for allowing pharmacy to be a part of this patient's care.  Arcola JanskyMeagan Amanpreet Delmont, PharmD Clinical Pharmacy Resident Pager: [phone] 04/06/2015 10:51 AM

## 2015-04-06 NOTE — Progress Notes (Signed)
PULMONARY / CRITICAL CARE MEDICINE   Name: Kyle Morrow MRN: [id number] DOB: 12/16/1940    ADMISSION DATE:  04/03/2015  REFERRING MD :    PRIMARY SERVICE: PCCM  CHIEF COMPLAINT:   Chief Complaint  Patient presents with  . Seizures    BRIEF PATIENT DESCRIPTION:  75 y.o.male with PMH of Parkinson's/dementia dependent on family for ADL's. Last seen at 2 AM and then found out in the cold by family around 8AM.  Brought to ER with core temp 23, seizing.   SIGNIFICANT EVENTS / STUDIES:  1/08 Admit with hypothermia 1/08 Intubated 1/09 EEG: Abn with moderately severe generalized continuous slowing of cerebral activity seen in metabolic/toxic/degenerative CNS disorders     LINES / TUBES: ETT 1/08>>> Left rad 1/8>>>1/10 LIJ 1/9>> PEG>>>  CULTURES: MRSA PCR 1/8>> NEG BCx 1/9>> Tracheal Cx 1/8>>  ANTIBIOTICS: Vanc 1/8>> Zosyn 1/8>>  SUBJECTIVE:  Pt sedated, remains on vent. pCXR improved this AM.  Propofol tolerated well  VITAL SIGNS: Temp:  [98.8 F (37.1 C)-100.2 F (37.9 C)] 100.2 F (37.9 C) (01/11 0600) Pulse Rate:  [73-98] 73 (01/11 0600) Resp:  [14-35] 14 (01/11 0600) BP: (114-187)/(69-123) 123/79 mmHg (01/11 0600) SpO2:  [99 %-100 %] 100 % (01/11 0600) Arterial Line BP: (131-212)/(60-90) 137/60 mmHg (01/10 1400) FiO2 (%):  [40 %] 40 % (01/11 0333) Weight:  [86.6 kg (190 lb 14.7 oz)] 86.6 kg (190 lb 14.7 oz) (01/11 0500) HEMODYNAMICS:   VENTILATOR SETTINGS: Vent Mode:  [-] PRVC FiO2 (%):  [40 %] 40 % Set Rate:  [18 bmp-26 bmp] 18 bmp Vt Set:  [410 mL] 410 mL PEEP:  [5 cmH20] 5 cmH20 Pressure Support:  [5 cmH20] 5 cmH20 Plateau Pressure:  [11 cmH20-15 cmH20] 12 cmH20 INTAKE / OUTPUT: Intake/Output      01/10 0701 - 01/11 0700   I.V. (mL/kg) 1664.2 (19.2)   NG/GT 1260   IV Piggyback 550   Total Intake(mL/kg) 3474.2 (40.1)   Urine (mL/kg/hr) 3075 (1.5)   Total Output 3075   Net +399.2         PHYSICAL EXAMINATION: General:  NAD, on vent  Neuro:  arouses to voice, purposeful HEENT:  NCAT, EOMI, ETT Cardiovascular:  RRR, no M/R/G heard Lungs:  ronchi rt base Abdomen:  +BS, soft, NT/ND Ext:  warm, dry; no edema  LABS:  PULMONARY  Recent Labs Lab 04/03/15 2146 04/03/15 2349 04/04/15 0345 04/04/15 1021 04/05/15 0403  PHART 7.299* 7.312* 7.355 7.337* 7.454*  PCO2ART 35.8 36.5 35.4 34.1* 28.4*  PO2ART 151.0* 140.0* 152* 119.0* 154*  HCO3 17.9* 18.4* 18.8* 18.0* 19.5*  TCO2 19 19 19.8 19 20.4  O2SAT 99.0 99.0 98.3 98.0 98.7    CBC  Recent Labs Lab 04/04/15 1023 04/05/15 0515 04/06/15 0231  HGB 10.9* 12.2* 11.5*  HCT 33.0* 36.7* 34.3*  WBC 10.6* 10.4 8.8  PLT 140* 119* 129*    COAGULATION  Recent Labs Lab 04/05/15 1100  INR 1.30    CARDIAC  No results for input(s): TROPONINI in the last 168 hours. No results for input(s): PROBNP in the last 168 hours.   CHEMISTRY  Recent Labs Lab 04/03/15 1246 04/03/15 1940 04/04/15 1023 04/05/15 0515 04/06/15 0231  NA 143 144 143 141 144  K 3.5 3.8 3.9 3.6 3.3*  CL 110 116* 118* 111 111  CO2 16* 22 20* 21* 27  GLUCOSE 125* 89 100* 147* 124*  BUN 29* 27* 19 15 14   CREATININE 1.15 1.06 1.01 0.83 0.82  CALCIUM 8.1* 7.8* 7.5*  7.7* 8.2*  MG  --  2.1  --   --   --    Estimated Creatinine Clearance: 85.9 mL/min (by C-G formula based on Cr of 0.82).   LIVER  Recent Labs Lab 04/03/15 1247 04/04/15 1023 04/05/15 0515 04/05/15 1100 04/06/15 0231  AST 33 151* 127*  --  79*  ALT 7* 30 9*  --  14*  ALKPHOS 55 42 43  --  42  BILITOT 0.6 1.1 1.3*  --  1.0  PROT 5.8* 5.0* 5.0*  --  5.0*  ALBUMIN 3.4* 2.7* 2.7*  --  2.6*  INR  --   --   --  1.30  --      INFECTIOUS  Recent Labs Lab 04/03/15 1244 04/03/15 2208 04/04/15 0157 04/04/15 0924  LATICACIDVEN 8.25* 0.9 1.3  --   PROCALCITON  --   --   --  2.13     ENDOCRINE CBG (last 3)   Recent Labs  04/05/15 1927 04/06/15 0029 04/06/15 0447  GLUCAP 127* 120* 120*     IMAGING x48h  Dg  Chest Port 1 View  04/05/2015  CLINICAL DATA:  Acute respiratory failure. EXAM: PORTABLE CHEST 1 VIEW COMPARISON:  04/04/2015. FINDINGS: Endotracheal tube, left IJ line, NG tube in stable position . Cardiomegaly with pulmonary venous congestion . Bilateral pulmonary infiltrates. Findings consistent congestive heart failure. Small bilateral pleural effusions. No pneumothorax . IMPRESSION: 1. Lines and tubes in stable position. 2. Cardiomegaly with pulmonary venous congestion and bilateral pulmonary infiltrates consistent congestive heart failure. Bilateral pneumonia cannot be excluded. Electronically Signed   By: Maisie Fus  Register   On: 04/05/2015 07:18   Dg Chest Port 1 View  04/04/2015  CLINICAL DATA:  Hypoxia. Recent seizure. Central catheter placement EXAM: PORTABLE CHEST 1 VIEW COMPARISON:  Study obtained earlier in the day FINDINGS: Endotracheal tube tip is 6.6 cm above the carina. Central catheter tip is in the superior vena cava. Nasogastric tube tip and side port are in the stomach. No pneumothorax. There are areas of patchy consolidation in the right medial right base and left lower lobe regions. No new opacity. Heart is upper normal in size with pulmonary vascularity within normal limits. No adenopathy. No bone lesions. IMPRESSION: Stable patchy areas of consolidation bilaterally. No new opacity. Tube and catheter positions as described without pneumothorax. Electronically Signed   By: Bretta Bang III M.D.   On: 04/04/2015 14:59    ASSESSMENT / PLAN:  PULMONARY A: Acute hypoxemic respiratory failure At risk aspiration  P:   Cont full vent support  Daily SBT if/when meets criteria - cpap 5 ps 5 Improved rt base on pcxr  CARDIOVASCULAR A:  Hypotension secondary to hypothermia / sepsis? - resolved Bradycardia - resolved  HTN P:  Hydralazine increase oral Prn hydral tele  RENAL A:   Hyperchloremia - resolving Hypokalemia  hypophos P:   Repleting K+, check mag Monitor  BMP Monitor I/Os Correct electrolytes as indicated Avoiding saline Consider lasix to neg  To even balance   GASTROINTESTINAL A:   Elevated transaminases - trending down slowly  P:   SUP: PPI TFs Add mirilax  HEMATOLOGIC A:   Anemia  Mild thrombocytopenia  improved  P:  VTE px: SQ hep and SCDs Monitor CBC  Transfuse per usual ICU guidelines  INFECTIOUS A:   Leukocytosis - resolved Asp PNA likely rt base - imrpoved P:  Micro and abx as above Zosyn and vanc dc Add unasyn, add stop date  ENDOCRINE A:  Relative AI - d/c hydrocortisone d/t HTN At risk pancreatic dysfxn P:   cbg  NEUROLOGIC A:  PD Dementia / parkinsons  P:   On home carbidopa/levodopa enteral pump  Daily WUA Continue to monitor NO haldol, Risperdal, antipsychotics!! Prop with wua, wean on low dose  Boykin Peek, MD Internal Medicine, PGY-3 04/06/2015 6:38 AM  STAFF NOTE: Cindi Carbon, MD FACP have personally reviewed patient's available data, including medical history, events of note, physical examination and test results as part of my evaluation. I have discussed with resident/NP and other care providers such as pharmacist, RN and RRT. In addition, I personally evaluated patient and elicited key findings ZO:[id number] commands this am , ronchi improved, pcxr improved atx / infiltrate, wean cpap5 ps 5  Goal 1 hr, will discuss reintubation status with family as would need trach if failed, pcxr in am , narrow abx, has NF and is from home, consider lasix soon to even balance, i updated family in full The patient is critically ill with multiple organ systems failure and requires high complexity decision making for assessment and support, frequent evaluation and titration of therapies, application of advanced monitoring technologies and extensive interpretation of multiple databases.   Critical Care Time devoted to patient care services described in this note is30  Minutes. This time reflects  time of care of this signee: Rory Percy, MD FACP. This critical care time does not reflect procedure time, or teaching time or supervisory time of PA/NP/Med student/Med Resident etc but could involve care discussion time. Rest per NP/medical resident whose note is outlined above and that I agree with   Mcarthur Rossetti. Tyson Alias, MD, FACP Pgr: [phone] [facility] 04/06/2015 10:09 AM

## 2015-04-07 LAB — GLUCOSE, CAPILLARY
GLUCOSE-CAPILLARY: 139 mg/dL — AB (ref 65–99)
GLUCOSE-CAPILLARY: 158 mg/dL — AB (ref 65–99)
GLUCOSE-CAPILLARY: 160 mg/dL — AB (ref 65–99)
Glucose-Capillary: 137 mg/dL — ABNORMAL HIGH (ref 65–99)
Glucose-Capillary: 109 mg/dL — ABNORMAL HIGH (ref 65–99)
Glucose-Capillary: 108 mg/dL — ABNORMAL HIGH (ref 65–99)

## 2015-04-07 LAB — BASIC METABOLIC PANEL
ANION GAP: 7 (ref 5–15)
BUN: 9 mg/dL (ref 6–20)
CALCIUM: 8.8 mg/dL — AB (ref 8.9–10.3)
CHLORIDE: 110 mmol/L (ref 101–111)
CO2: 29 mmol/L (ref 22–32)
Creatinine, Ser: 0.73 mg/dL (ref 0.61–1.24)
GFR calc Af Amer: >60 mL/min (ref >60)
GFR calc non Af Amer: >60 mL/min (ref >60)
GLUCOSE: 141 mg/dL — AB (ref 65–99)
Potassium: 3.5 mmol/L (ref 3.5–5.1)
Sodium: 146 mmol/L — ABNORMAL HIGH (ref 135–145)

## 2015-04-07 MED ORDER — BISACODYL 10 MG RE SUPP: 10.0000 mg | Freq: Once | RECTAL | Status: DC | PRN

## 2015-04-07 MED ORDER — ASPIRIN 300 MG RE SUPP: 300.0000 mg | Freq: Every day | RECTAL | Status: DC

## 2015-04-07 MED ORDER — CETYLPYRIDINIUM CHLORIDE 0.05 % MT LIQD
7.0000 mL | Freq: Two times a day (BID) | OROMUCOSAL | Status: DC
  Administered 2015-04-07: 7 mL via OROMUCOSAL

## 2015-04-07 MED ORDER — POTASSIUM CHLORIDE 20 MEQ/15ML (10%) PO SOLN
20.0000 meq | ORAL | Status: AC
  Administered 2015-04-07 (×2): 20 meq
  Filled 2015-04-07 (×2): qty 15

## 2015-04-07 MED ORDER — BISACODYL 10 MG RE SUPP: 10.0000 mg | Freq: Once | RECTAL | Status: DC

## 2015-04-07 MED ORDER — BISACODYL 10 MG RE SUPP
10.0000 mg | Freq: Once | RECTAL | Status: DC
Start: 2015-04-07 — End: 2015-04-14
  Filled 2015-04-07: qty 1

## 2015-04-07 NOTE — Progress Notes (Signed)
Patient extubated to 2 L [place]. Patient remained stable throughout and no complications noted during extubation. Patient vitals currently stable and no complications noted. RT will continue to monitor patient.

## 2015-04-07 NOTE — Progress Notes (Signed)
PULMONARY / CRITICAL CARE MEDICINE   Name: Kyle Morrow MRN: [id number] DOB: [birth year]    ADMISSION DATE:  04/03/2015  REFERRING MD :    PRIMARY SERVICE: PCCM  CHIEF COMPLAINT:   Chief Complaint  Patient presents with  . Seizures    BRIEF PATIENT DESCRIPTION:  75 y.o.male with PMH of Parkinson's/dementia dependent on family for ADL's. Last seen at 2 AM and then found out in the cold by family around 8AM.  Brought to ER with core temp 23, seizing.   SIGNIFICANT EVENTS / STUDIES:  1/08 Admit with hypothermia 1/08 Intubated 1/09 EEG: Abn with moderately severe generalized continuous slowing of cerebral activity seen in metabolic/toxic/degenerative CNS disorders    1/11- too lethargic to extubate, weaned well  LINES / TUBES: ETT 1/08>>> Left rad 1/8>>>1/10 LIJ 1/9>> PEG>>>  CULTURES: MRSA PCR 1/8>> NEG BCx 1/9>> Tracheal Cx 1/8>>  ANTIBIOTICS: Vanc 1/8>> Zosyn 1/8>>  SUBJECTIVE:  Neg 1.3 liters More awake  VITAL SIGNS: Temp:  [98.6 F (37 C)-100.5 F (38.1 C)] 100 F (37.8 C) (01/12 0800) Pulse Rate:  [76-120] 109 (01/12 0800) Resp:  [14-27] 17 (01/12 0800) BP: (125-184)/(72-99) 157/91 mmHg (01/12 0800) SpO2:  [96 %-100 %] 100 % (01/12 0800) FiO2 (%):  [40 %] 40 % (01/12 0800) Weight:  [85 kg (187 lb 6.3 oz)] 85 kg (187 lb 6.3 oz) (01/12 0329) HEMODYNAMICS:   VENTILATOR SETTINGS: Vent Mode:  [-] PSV;CPAP FiO2 (%):  [40 %] 40 % Set Rate:  [18 bmp] 18 bmp Vt Set:  [410 mL] 410 mL PEEP:  [5 cmH20] 5 cmH20 Pressure Support:  [5 cmH20] 5 cmH20 Plateau Pressure:  [12 cmH20-13 cmH20] 13 cmH20 INTAKE / OUTPUT: Intake/Output      01/11 0701 - 01/12 0700 01/12 0701 - 01/13 0700   I.V. (mL/kg) 460.9 (5.4) 14.6 (0.2)   NG/GT 1225 70   IV Piggyback 906.7    Total Intake(mL/kg) 2592.6 (30.5) 84.6 (1)   Urine (mL/kg/hr) 3950 (1.9)    Total Output 3950     Net -1357.5 +84.6          PHYSICAL EXAMINATION: General:  NAD, on vent  Neuro: arouses to voice,  purposeful, off prop, cough strong HEENT:  ett wnl Cardiovascular:  RRR, no M/R/G heard Lungs:  CTA Abdomen:  +BS, soft, NT/ND Ext:  warm, dry; no edema  LABS:  PULMONARY  Recent Labs Lab 04/03/15 2146 04/03/15 2349 04/04/15 0345 04/04/15 1021 04/05/15 0403  PHART 7.299* 7.312* 7.355 7.337* 7.454*  PCO2ART 35.8 36.5 35.4 34.1* 28.4*  PO2ART 151.0* 140.0* 152* 119.0* 154*  HCO3 17.9* 18.4* 18.8* 18.0* 19.5*  TCO2 19 19 19.8 19 20.4  O2SAT 99.0 99.0 98.3 98.0 98.7    CBC  Recent Labs Lab 04/04/15 1023 04/05/15 0515 04/06/15 0231  HGB 10.9* 12.2* 11.5*  HCT 33.0* 36.7* 34.3*  WBC 10.6* 10.4 8.8  PLT 140* 119* 129*    COAGULATION  Recent Labs Lab 04/05/15 1100  INR 1.30    CARDIAC  No results for input(s): TROPONINI in the last 168 hours. No results for input(s): PROBNP in the last 168 hours.   CHEMISTRY  Recent Labs Lab 04/03/15 1940 04/04/15 1023 04/05/15 0515 04/06/15 0231 04/07/15 0345  NA 144 143 141 144 146*  K 3.8 3.9 3.6 3.3* 3.5  CL 116* 118* 111 111 110  CO2 22 20* 21* 27 29  GLUCOSE 89 100* 147* 124* 141*  BUN 27* 19 15 14 9   CREATININE 1.06 1.01 0.83  0.82 0.73  CALCIUM 7.8* 7.5* 7.7* 8.2* 8.8*  MG 2.1  --   --  2.0  --   PHOS  --   --   --  1.7*  --    Estimated Creatinine Clearance: 87.2 mL/min (by C-G formula based on Cr of 0.73).   LIVER  Recent Labs Lab 04/03/15 1247 04/04/15 1023 04/05/15 0515 04/05/15 1100 04/06/15 0231  AST 33 151* 127*  --  79*  ALT 7* 30 9*  --  14*  ALKPHOS 55 42 43  --  42  BILITOT 0.6 1.1 1.3*  --  1.0  PROT 5.8* 5.0* 5.0*  --  5.0*  ALBUMIN 3.4* 2.7* 2.7*  --  2.6*  INR  --   --   --  1.30  --      INFECTIOUS  Recent Labs Lab 04/03/15 1244 04/03/15 2208 04/04/15 0157 04/04/15 0924  LATICACIDVEN 8.25* 0.9 1.3  --   PROCALCITON  --   --   --  2.13     ENDOCRINE CBG (last 3)   Recent Labs  04/06/15 2007 04/07/15 0347 04/07/15 0803  GLUCAP 118* 137* 160*     IMAGING  x48h  Dg Chest Port 1 View  04/06/2015  CLINICAL DATA:  Acute respiratory failure, Parkinson's disease, seizure activity, remote history of smoking. EXAM: PORTABLE CHEST 1 VIEW COMPARISON:  Portable chest x-ray of April 05, 2015 FINDINGS: The lungs are reasonably well inflated. There is bibasilar atelectasis greater on the right. No alveolar infiltrates are observed. The cardiac silhouette is mildly enlarged but stable. The central pulmonary vascularity is less prominent today. The endotracheal tube tip lies 4.8 cm above the carina. The esophagogastric tube tip projects below the inferior margin of the image. The left internal jugular venous catheter tip projects over the midportion of the SVC. The bony thorax is unremarkable where visualized. IMPRESSION: Interval improvement in bibasilar atelectasis or pneumonia. Mild stable cardiomegaly and improved central pulmonary vascular prominence. The support tubes are in reasonable position. Electronically Signed   By: David  [place] M.D.   On: 04/06/2015 06:55    ASSESSMENT / PLAN:  PULMONARY A: Acute hypoxemic respiratory failure At risk aspiration  P:   Cont full vent support  Daily SBT if/when meets criteria - cpap 5 ps 5 His neurostatus may support extubation, he sleeps a lot at baseline Cough is strong  CARDIOVASCULAR A:  Hypotension secondary to hypothermia / sepsis? - resolved Bradycardia - resolved  HTN P:  Hydralazine maintain  Prn hydral tele  RENAL A:   Hyperchloremia - resolving Hypokalemia  hypophos P:   Would have even balance No edema No lasix as of now Chem in am   GASTROINTESTINAL A:   Elevated transaminases - trending down slowly  P:   SUP: PPI TFs holding as weaning ok mirilax Add dulx supp  HEMATOLOGIC A:   Anemia  Mild thrombocytopenia  improved  P:  VTE px: SQ hep and SCDs Monitor CBC  Transfuse per usual ICU guidelines  INFECTIOUS A:   Leukocytosis - resolved Asp PNA likely rt base -  imrpoved P:  unasyn to stop date Rt base had been improved  ENDOCRINE A:   Relative AI - d/c hydrocortisone d/t HTN At risk pancreatic dysfxn P:   cbg  NEUROLOGIC A:  PD Dementia / parkinsons P:   On home carbidopa/levodopa enteral pump  Daily WUA Continue to monitor NO haldol, Risperdal, antipsychotics!! Prop with wua, off now NO benzo strict  i  updated son  Ccm time 30 min   Mcarthur RossettiDaniel J. Tyson [name], MD, FACP Pgr: [phone] [facility]

## 2015-04-07 NOTE — Care Management Important Message (Signed)
Important Message  Patient Details  Name: Kyle Morrow MRN: [id number] Date of Birth: 04/16/1940   Medicare Important Message Given:  Yes    [name] [name] 04/07/2015, 1:28 PM

## 2015-04-07 NOTE — Progress Notes (Signed)
[facility] FOR [facility] LAB REPLACEMENT ONLY  The patient does [facility]ly[facility] Protocol based on the criteria listed below:   1. Is GFR >/= 40 ml/min? Yes.    Patient's GFR today is >60 2. Is urine output >/= 0.5 ml/kg/hr for the last 6 hours? Yes.   Patient's UOP is 1.26 ml/kg/hr 3. Is BUN < 60 mg/dL? Yes.    Patient's BUN today is9 4. Abnormal electrolyte(s): Potassium 3.5 5. Ordered repletion with: Potassium per Protocol  Jai Steil P 04/07/2015 5:00 AM

## 2015-04-08 LAB — GLUCOSE, CAPILLARY
GLUCOSE-CAPILLARY: 135 mg/dL — AB (ref 65–99)
Glucose-Capillary: 108 mg/dL — ABNORMAL HIGH (ref 65–99)
Glucose-Capillary: 108 mg/dL — ABNORMAL HIGH (ref 65–99)
Glucose-Capillary: 122 mg/dL — ABNORMAL HIGH (ref 65–99)
Glucose-Capillary: 154 mg/dL — ABNORMAL HIGH (ref 65–99)

## 2015-04-08 LAB — BASIC METABOLIC PANEL
Anion gap: 11 (ref 5–15)
BUN: 12 mg/dL (ref 6–20)
CHLORIDE: 107 mmol/L (ref 101–111)
CO2: 27 mmol/L (ref 22–32)
CREATININE: 0.82 mg/dL (ref 0.61–1.24)
Calcium: 9.1 mg/dL (ref 8.9–10.3)
GFR calc Af Amer: >60 mL/min (ref >60)
GFR calc non Af Amer: >60 mL/min (ref >60)
GLUCOSE: 120 mg/dL — AB (ref 65–99)
POTASSIUM: 3.7 mmol/L (ref 3.5–5.1)
Sodium: 145 mmol/L (ref 135–145)

## 2015-04-08 MED ORDER — ASPIRIN 325 MG PO TABS
325.0000 mg | ORAL_TABLET | Freq: Every day | ORAL | Status: DC
  Administered 2015-04-08 – 2015-04-14 (×7): 325 mg via ORAL
  Filled 2015-04-08 (×7): qty 1

## 2015-04-08 MED ORDER — LABETALOL HCL 5 MG/ML IV SOLN: 10.0000 mg | INTRAVENOUS | Status: DC | PRN

## 2015-04-08 MED ORDER — LABETALOL HCL 100 MG PO TABS
100.0000 mg | ORAL_TABLET | Freq: Two times a day (BID) | ORAL | Status: DC
  Administered 2015-04-08 – 2015-04-14 (×12): 100 mg via ORAL
  Filled 2015-04-08 (×13): qty 1

## 2015-04-08 MED ORDER — CETYLPYRIDINIUM CHLORIDE 0.05 % MT LIQD
7.0000 mL | Freq: Two times a day (BID) | OROMUCOSAL | Status: DC
  Administered 2015-04-08 – 2015-04-14 (×9): 7 mL via OROMUCOSAL

## 2015-04-08 MED ORDER — RESOURCE THICKENUP CLEAR PO POWD
ORAL | Status: DC | PRN
  Filled 2015-04-08 (×2): qty 125

## 2015-04-08 MED ORDER — CHLORHEXIDINE GLUCONATE 0.12 % MT SOLN
15.0000 mL | Freq: Two times a day (BID) | OROMUCOSAL | Status: DC
  Administered 2015-04-08 – 2015-04-14 (×8): 15 mL via OROMUCOSAL
  Filled 2015-04-08 (×8): qty 15

## 2015-04-08 NOTE — Progress Notes (Signed)
Stopped pt's personal medication pump at midnight. Flushed large port with 20 cc and small port with 10 cc of sterile water. Medication canister was discarded in the sharps bin. Will let day shift nurse know instructions on obtaining new canister from pharmacy in the AM.

## 2015-04-08 NOTE — Progress Notes (Signed)
Nutrition Follow-up   INTERVENTION:   Magic cup TID with meals, each supplement provides 290 kcal and 9 grams of protein  NUTRITION DIAGNOSIS:   Inadequate oral intake related to dysphagia as evidenced by meal completion < 50%. Ongoing.   GOAL:   Patient will meet greater than or equal to 90% of their needs Not met.   MONITOR:   PO intake, Supplement acceptance, Diet advancement, Labs, Skin, I & O's  ASSESSMENT:   Pt with hx of Parkinsons's/dementia and dependent on family for ADL's, uses carbidopa-levodopa through PEG. Admitted after being found out in the cold by family with hypothermia and seizures.   1/12 extubated Pt has PEG, seen by SLP and started on dysphagia diet. (Dysphagia 1 with Pudding thick liquids), hopeful for diet advancement tomorrow.  CBG's: 108-135 Medications reviewed and include: dulcolax, miralax  Diet Order:  DIET - DYS 1 Room service appropriate?: Yes; Fluid consistency:: Pudding Thick  Skin:  Wound (see comment) (Unstageable wounds to elbow and both hands)  Last BM:  1/13  Height:   Ht Readings from Last 1 Encounters:  04/03/15 5' 8"  (1.727 m)   Weight:   Wt Readings from Last 1 Encounters:  04/08/15 189 lb 13.1 oz (86.1 kg)   Ideal Body Weight:  70 kg  BMI:  Body mass index is 28.87 kg/(m^2).  Estimated Nutritional Needs:   Kcal:  2000-2100  Protein:  100-115 grams  Fluid:  > 2 L/day  EDUCATION NEEDS:   No education needs identified at this time  [facility], [place], [place] Pager [phone] After Hours Pager

## 2015-04-08 NOTE — Evaluation (Signed)
Clinical/Bedside Swallow Evaluation Patient Details  Name: Kyle Morrow MRN: [id number] Date of Birth: 06/13/1940  Today's Date: 04/08/2015 Time: SLP Start Time (ACUTE ONLY): 1110 SLP Stop Time (ACUTE ONLY): 1140 SLP Time Calculation (min) (ACUTE ONLY): 30 min  Past Medical History:  Past Medical History  Diagnosis Date  . Parkinson disease [facility](HCC)    Past Surgical History:  Past Surgical History  Procedure Laterality Date  . Hip surgery      Left   HPI:  75 y.o.male with PMH of Parkinson's/dementia dependent on family for ADL's. Last seen at 2 AM and then found out in the cold by family around 8AM. Brought to ER with core temp 23, seizing. Intubated on 04/04/15, extubated 04/07/15.    Assessment / Plan / Recommendation Clinical Impression  Pt presents with concern for reduced sensation of aspiration given consitent throat clearing followed by delayed hard coughing with thin and nectar thick trials regardless of bolus size. Pt and family report no dysphagia prior to admission, though pt is aware that Parkinson's often leads to dysphagia. He has been recieving his medications regularly. Given that pts swallow appears strong, suspect there may be an acute reversible dysphagia related to intubation. Pt may have pudding/puree and meds crushed, no liquids. Will f/u for PO trials tomorrow with hope for improvement; objective testing may be necessary if signs of aspiration persist.     Aspiration Risk  Moderate aspiration risk    Diet Recommendation Dysphagia 1 (Puree);Pudding-thick liquid   Medication Administration: Whole meds with puree Postural Changes: Seated upright at 90 degrees    Other  Recommendations Oral Care Recommendations: Oral care QID Other Recommendations: Order thickener from pharmacy   Follow up Recommendations  24 hour supervision/assistance    Frequency and Duration min 2x/week  2 weeks       Prognosis        Swallow Study   General HPI: 75 y.o.male with  PMH of Parkinson's/dementia dependent on family for ADL's. Last seen at 2 AM and then found out in the cold by family around 8AM. Brought to ER with core temp 23, seizing. Intubated on 04/04/15, extubated 04/07/15.  Type of Study: Bedside Swallow Evaluation Previous Swallow Assessment: none Diet Prior to this Study: NPO Respiratory Status: Nasal cannula History of Recent Intubation: Yes Length of Intubations (days): 4 days Date extubated: 04/07/15 Behavior/Cognition: Alert;Cooperative;Pleasant mood Oral Cavity Assessment: Within Functional Limits Oral Care Completed by SLP: No Oral Cavity - Dentition: Adequate natural dentition Vision: Functional for self-feeding Self-Feeding Abilities: Needs assist Patient Positioning: Upright in bed Baseline Vocal Quality: Low vocal intensity Volitional Cough: Strong Volitional Swallow: Able to elicit    Oral/Motor/Sensory Function     Ice Chips     Thin Liquid Thin Liquid: Impaired Presentation: Cup;Straw Pharyngeal  Phase Impairments: Cough - Delayed;Throat Clearing - Immediate    Nectar Thick Nectar Thick Liquid: Impaired Presentation: Cup Pharyngeal Phase Impairments: Throat Clearing - Immediate;Cough - Delayed   Honey Thick Honey Thick Liquid: Not tested   Puree Puree: Within functional limits   Solid   GO   Solid: Within functional limits       Harlon DittyBonnie Ande Therrell, MA CCC-SLP [phone]  Claudine MoutonDeBlois, Baldo Hufnagle Caroline 04/08/2015,12:33 PM

## 2015-04-08 NOTE — Progress Notes (Signed)
PULMONARY / CRITICAL CARE MEDICINE   Name: Kyle KrasCharles Morrow MRN: [id number] DOB: 06/28/1940    ADMISSION DATE:  04/03/2015  REFERRING MD :    PRIMARY SERVICE: PCCM  CHIEF COMPLAINT:  Hypothermia, seizures  BRIEF PATIENT DESCRIPTION:  75 y.o.male with PMH of Parkinson's/dementia dependent on family for ADL's. Last seen at 2 AM and then found out in the cold by family around 8AM.  Brought to ER with core temp 23, seizing.   SIGNIFICANT EVENTS / STUDIES:  1/08 Admit with hypothermia 1/08 Intubated 1/09 EEG: Abn with moderately severe generalized continuous slowing of cerebral activity seen in metabolic/toxic/degenerative CNS disorders    1/11- too lethargic to extubate, weaned well 1/12 Extubated  LINES / TUBES: ETT 1/08>>> Left rad 1/8>>>1/10 LIJ 1/9>> PEG>>>  CULTURES: MRSA PCR 1/8>> NEG BCx 1/9>> Tracheal Cx 1/8>>  ANTIBIOTICS: Vanc 1/8>>1/11 Zosyn 1/8>>1/11 unasyn 1/11>>>  SUBJECTIVE: BP elevated today, otherwise doing very well. Extubated 1/12.   VITAL SIGNS: Temp:  [98.1 F (36.7 C)-100 F (37.8 C)] 99.5 F (37.5 C) (01/13 0800) Pulse Rate:  [82-112] 96 (01/13 0800) Resp:  [12-24] 17 (01/13 0800) BP: (122-169)/(69-103) 152/79 mmHg (01/13 0800) SpO2:  [98 %-100 %] 100 % (01/13 0800) Weight:  [189 lb 13.1 oz (86.1 kg)] 189 lb 13.1 oz (86.1 kg) (01/13 0330) HEMODYNAMICS:   VENTILATOR SETTINGS:   INTAKE / OUTPUT: Intake/Output      01/12 0701 - 01/13 0700 01/13 0701 - 01/14 0700   I.V. (mL/kg) 244.6 (2.8) 10 (0.1)   NG/GT 210    IV Piggyback 400    Total Intake(mL/kg) 854.6 (9.9) 10 (0.1)   Urine (mL/kg/hr) 1540 (0.7) 85 (0.3)   Stool 0 (0)    Total Output 1540 85   Net -685.4 -75        Stool Occurrence 3 x      PHYSICAL EXAMINATION: General:  NAD, on vent  Neuro: arouses to voice, purposeful, off prop, cough strong HEENT:  ett wnl Cardiovascular:  RRR, no M/R/G heard Lungs:  CTA Abdomen:  +BS, soft, NT/ND Ext:  warm, dry; no  edema  LABS:  PULMONARY  Recent Labs Lab 04/03/15 2146 04/03/15 2349 04/04/15 0345 04/04/15 1021 04/05/15 0403  PHART 7.299* 7.312* 7.355 7.337* 7.454*  PCO2ART 35.8 36.5 35.4 34.1* 28.4*  PO2ART 151.0* 140.0* 152* 119.0* 154*  HCO3 17.9* 18.4* 18.8* 18.0* 19.5*  TCO2 19 19 19.8 19 20.4  O2SAT 99.0 99.0 98.3 98.0 98.7    CBC  Recent Labs Lab 04/04/15 1023 04/05/15 0515 04/06/15 0231  HGB 10.9* 12.2* 11.5*  HCT 33.0* 36.7* 34.3*  WBC 10.6* 10.4 8.8  PLT 140* 119* 129*    COAGULATION  Recent Labs Lab 04/05/15 1100  INR 1.30     CHEMISTRY  Recent Labs Lab 04/03/15 1940 04/04/15 1023 04/05/15 0515 04/06/15 0231 04/07/15 0345 04/08/15 0420  NA 144 143 141 144 146* 145  K 3.8 3.9 3.6 3.3* 3.5 3.7  CL 116* 118* 111 111 110 107  CO2 22 20* 21* 27 29 27   GLUCOSE 89 100* 147* 124* 141* 120*  BUN 27* 19 15 14 9 12   CREATININE 1.06 1.01 0.83 0.82 0.73 0.82  CALCIUM 7.8* 7.5* 7.7* 8.2* 8.8* 9.1  MG 2.1  --   --  2.0  --   --   PHOS  --   --   --  1.7*  --   --     LIVER  Recent Labs Lab 04/03/15 1247 04/04/15 1023 04/05/15  0515 04/05/15 1100 04/06/15 0231  AST 33 151* 127*  --  79*  ALT 7* 30 9*  --  14*  ALKPHOS 55 42 43  --  42  BILITOT 0.6 1.1 1.3*  --  1.0  PROT 5.8* 5.0* 5.0*  --  5.0*  ALBUMIN 3.4* 2.7* 2.7*  --  2.6*  INR  --   --   --  1.30  --      INFECTIOUS  Recent Labs Lab 04/03/15 1244 04/03/15 2208 04/04/15 0157 04/04/15 0924  LATICACIDVEN 8.25* 0.9 1.3  --   PROCALCITON  --   --   --  2.13     ENDOCRINE CBG (last 3)   Recent Labs  04/07/15 2359 04/08/15 0344 04/08/15 0733  GLUCAP 108* 108* 135*     IMAGING  No results found.  ASSESSMENT / PLAN:  PULMONARY A: Acute hypoxemic respiratory failure; resolved At risk aspiration  P:   Supplemental O2 SLP evaluation  CARDIOVASCULAR A:  Hypertension P:  Start Labetalol 100 mg bid Hydralazine prn Telemetry D/c central line  RENAL A:   No  acute issues P:   Repeat BMP in AM D/c Foley  GASTROINTESTINAL A:   Elevated transaminases Nutrition P:   Discontinue PPI Miralax  SLP evaluation; then start diet  HEMATOLOGIC A:   Anemia  Mild thrombocytopenia P:  VTE px: SQ hep and SCDs Monitor CBC   INFECTIOUS A:   Leukocytosis - resolved Asp PNA likely rt base - imrpoved P:  unasyn to stop date  ENDOCRINE A:   No acute issues P:   Glucose on BMP  NEUROLOGIC A:  PD Dementia / parkinsons P:   On home carbidopa/levodopa enteral pump  Continue to monitor Restart Aricept one taking po   Transfer to [facility]   Lauris Chroman, MD PGY-3, Internal Medicine Pager: [phone]    STAFF NOTE: Cindi Carbon, MD FACP have personally reviewed patient's available data, including medical history, events of note, physical examination and test results as part of my evaluation. I have discussed with resident/NP and other care providers such as pharmacist, RN and RRT. In addition, I personally evaluated patient and elicited key findings of: Extubated, lung improving on examination to clear, talking, for SLP, to move to med floor, treat HTN, await oral access, dc line neck, PT, maintain parkinsons meds, dnr but ett elective in future if needed  To [facility], med floor Mcarthur Rossetti. Tyson Alias, MD, FACP Pgr: [phone] [facility] 04/08/2015 11:11 AM

## 2015-04-09 ENCOUNTER — Inpatient Hospital Stay (HOSPITAL_COMMUNITY): Payer: Medicare Other

## 2015-04-09 DIAGNOSIS — J9600 Acute respiratory failure, unspecified whether with hypoxia or hypercapnia: Secondary | ICD-10-CM

## 2015-04-09 DIAGNOSIS — G20 Parkinson's disease: Secondary | ICD-10-CM

## 2015-04-09 DIAGNOSIS — J690 Pneumonitis due to inhalation of food and vomit: Secondary | ICD-10-CM

## 2015-04-09 LAB — BASIC METABOLIC PANEL
Anion gap: 11 (ref 5–15)
BUN: 19 mg/dL (ref 6–20)
CALCIUM: 9.1 mg/dL (ref 8.9–10.3)
CO2: 27 mmol/L (ref 22–32)
CREATININE: 0.8 mg/dL (ref 0.61–1.24)
Chloride: 108 mmol/L (ref 101–111)
GFR calc non Af Amer: >60 mL/min (ref >60)
Glucose, Bld: 138 mg/dL — ABNORMAL HIGH (ref 65–99)
Potassium: 3.9 mmol/L (ref 3.5–5.1)
SODIUM: 146 mmol/L — AB (ref 135–145)

## 2015-04-09 LAB — CBC
HEMATOCRIT: 39.2 % (ref 39.0–52.0)
Hemoglobin: 12.4 g/dL — ABNORMAL LOW (ref 13.0–17.0)
MCH: 27.5 pg (ref 26.0–34.0)
MCHC: 31.6 g/dL (ref 30.0–36.0)
MCV: 86.9 fL (ref 78.0–100.0)
Platelets: 168 10*3/uL (ref 150–400)
RBC: 4.51 MIL/uL (ref 4.22–5.81)
RDW: 14 % (ref 11.5–15.5)
WBC: 7.3 10*3/uL (ref 4.0–10.5)

## 2015-04-09 LAB — CULTURE, BLOOD (ROUTINE X 2)
CULTURE: NO GROWTH
CULTURE: NO GROWTH

## 2015-04-09 LAB — GLUCOSE, CAPILLARY: Glucose-Capillary: 147 mg/dL — ABNORMAL HIGH (ref 65–99)

## 2015-04-09 MED ORDER — DEXTROSE-NACL 5-0.45 % IV SOLN
INTRAVENOUS | Status: AC
  Administered 2015-04-09: 16:00:00 via INTRAVENOUS

## 2015-04-09 NOTE — Progress Notes (Signed)
Speech Language Pathology Treatment: Dysphagia  Patient Details Name: Kyle Morrow MRN: [id number] DOB: 05/25/1940 Today's Date: 04/09/2015 Time: [id number] SLP Time Calculation (min) (ACUTE ONLY): 15 min  Assessment / Plan / Recommendation Clinical Impression  Pt seen for diet tolerance and upgraded PO trials this date. Although swallow initiation appearing fairly timely, pt exhibited immediate and delayed throat clearing and coughing following both puree and honey thick liquids concerning for reduced airway protection. Pt with fluctuation of low grade fevers and exhibits confusion further increasing aspiration risk. SLP re entered room with pt noted to have coughed up green/yellow phlegm.  Recommend NPO execpt for medicines crushed in puree until MBS can be completed. SLP to attempt MBS as soon as possible.    HPI HPI: 75 y.o.male with PMH of Parkinson's/dementia dependent on family for ADL's. Last seen at 2 AM and then found out in the cold by family around 8AM. Brought to ER with core temp 23, seizing. Intubated on 04/04/15, extubated 04/07/15.       SLP Plan  MBS     Recommendations  Diet recommendations: NPO Medication Administration: Crushed with puree Supervision: Full supervision/cueing for compensatory strategies;Staff to assist with self feeding Compensations: Minimize environmental distractions;Slow rate;Small sips/bites Postural Changes and/or Swallow Maneuvers: Seated upright 90 degrees;Upright 30-60 min after meal             Oral Care Recommendations: Oral care QID Follow up Recommendations: 24 hour supervision/assistance Plan: MBS     GO              Marcene Duoshelsea Sumney MA, CCC-SLP Acute Care Speech Language Pathologist     Kennieth RadSumney, Mingo Siegert E 04/09/2015, 11:42 AM

## 2015-04-09 NOTE — Progress Notes (Signed)
Pharmacy Antibiotic Follow-up Note  Kyle Morrow is a 75 y.o. year-old male admitted on 04/03/2015.  Pt was found down out in cold by family and began having fevers after rewarming. Assessment/Plan: The patient is currently on day# 6 of ABX, narrowed to Unasyn since 1/11 for potential aspiration pneumonia. WBC wnl, Tc 98.3, Tm 99.4,  SCr stable at 0.8 Blood cultures 1/9 are negative. Unasyn to continue thru 1/15 pm to complete 7 days of ABX   Temp (24hrs), Avg:98.6 F (37 C), Min:97.6 F (36.4 C), Max:99.4 F (37.4 C)   Recent Labs Lab 04/03/15 1246 04/04/15 1023 04/05/15 0515 04/06/15 0231 04/09/15 0845  WBC 17.5* 10.6* 10.4 8.8 7.3     Recent Labs Lab 04/05/15 0515 04/06/15 0231 04/07/15 0345 04/08/15 0420 04/09/15 0845  CREATININE 0.83 0.82 0.73 0.82 0.80   Estimated Creatinine Clearance: 87.7 mL/min (by C-G formula based on Cr of 0.8).    No Known Allergies  Antimicrobials this admission: 1/9 vancomycin > 1/11 1/9 Zosyn > 1/11 1/11 Unasyn >>  Order to stop 1/15 23:59  Levels/dose changes this admission: none  Microbiology results: 1/9 BCx x2 > Neg/final 1/9 Sputum > mod G+ cocci in pairs on stain 1/8 MRSA PCR neg  Thank you for allowing pharmacy to be a part of this patient's care.  Noah Delaineuth Lexee Brashears, RPh Clinical Pharmacist Pager: [phone]  04/09/2015 3:24 PM

## 2015-04-09 NOTE — Evaluation (Signed)
Physical Therapy Evaluation Patient Details Name: Kyle Morrow MRN: [id number] DOB: 11/05/1940 Today's Date: 04/09/2015   History of Present Illness  Adm 04/03/15 after found down outside with hypothermia and seizures. Pt intubated 1/8-1/12. PMHx-Parkinson's with dementia and carbidopa pump (administered via PEG-like tube), Lt hip surgery     Clinical Impression  Pt admitted with above diagnosis. Mobility declined due to decr ROM, stiffness, and weakness due to prolonged bedrest/hospitalization. Discharge planning clearly must take into consideration that pt wandered away from home and if return home is possible. Feel inpatient rehab is best option for working through current Parkinson's related mobility deficits. Pt currently with functional limitations due to the deficits listed below (see PT Problem List).  Pt will benefit from skilled PT to increase their independence and safety with mobility to allow discharge to the venue listed below.       Follow Up Recommendations CIR;Supervision/Assistance - 24 hour (see above re: ?social support)    Equipment Recommendations  Other (comment) (TBA)    Recommendations for Other Services Rehab consult;OT consult     Precautions / Restrictions Precautions Precautions: Fall      Mobility  Bed Mobility Overal bed mobility: Needs Assistance;+2 for physical assistance Bed Mobility: Rolling;Sidelying to Sit Rolling: Max assist;+2 for physical assistance;+2 for safety/equipment Sidelying to sit: Max assist;+2 for physical assistance;+2 for safety/equipment       General bed mobility comments: Able to assist with flexing legs and maintaining while rolling, reaches with UE towards rail; total assist for legs over EOB, however does use UEs to assist to raise torso; total assist to scoot to EOB in sitting  Transfers Overall transfer level: Needs assistance Equipment used: 2 person hand held assist Transfers: Sit to/from [employer] CorporationStand;Stand Pivot  Transfers Sit to Stand: Mod assist;+2 physical assistance;+2 safety/equipment;From elevated surface Stand pivot transfers: Max assist;+2 physical assistance;+2 safety/equipment;From elevated surface       General transfer comment: initiates standing with multi-modal cues; use of pad under pelvis to elevate pelvis; unable to achieve foot-flat due to decr bil DF; full assist to shift Rt and Lt to advance each LE during pivot  Ambulation/Gait             General Gait Details: unable  Stairs            Wheelchair Mobility    Modified Rankin (Stroke Patients Only)       Balance Overall balance assessment: Needs assistance Sitting-balance support: No upper extremity supported;Feet supported Sitting balance-Leahy Scale: Poor Sitting balance - Comments: can sit without physical assist after 60 seconds of positioing/support; requires close guarding Postural control: Left lateral lean Standing balance support: Bilateral upper extremity supported Standing balance-Leahy Scale: Zero Standing balance comment: see transfer                             Pertinent Vitals/Pain On 2L O2 via [place] with SaO2 >93% throughout  Pain Assessment: No/denies pain    Home Living Family/patient expects to be discharged to:: Unsure Living Arrangements: Spouse/significant other;Children             Home Equipment: Gilmer Morane - single point Additional Comments: pt unable to provide details    Prior Function Level of Independence: Needs assistance   Gait / Transfers Assistance Needed: pt reports he walks with a cane sometimes     Comments: no family present     Hand Dominance   Dominant Hand:  (ambidextrous)    Extremity/Trunk  Assessment   Upper Extremity Assessment: RUE deficits/detail;LUE deficits/detail;Defer to OT evaluation RUE Deficits / Details: decr ROM shoulder and elbow (lacks full extension ~-40)     LUE Deficits / Details: decr ROM shoulder and elbow (lacks  full extension ~-40)   Lower Extremity Assessment: RLE deficits/detail;LLE deficits/detail RLE Deficits / Details: AAROM limited DF -30; otherwise Parkinson's stiffness LLE Deficits / Details: AAROM limited DF -40; otherwise Parkinson's stiffness  Cervical / Trunk Assessment: Kyphotic;Other exceptions  Communication   Communication: No difficulties  Cognition Arousal/Alertness: Awake/alert Behavior During Therapy: Flat affect Overall Cognitive Status: No family/caregiver present to determine baseline cognitive functioning (disoriented; ?confabulation (story re: dtr-in-law hospitaliz)       Memory: Decreased short-term memory (documented h/o dementia)              General Comments General comments (skin integrity, edema, etc.): Initially pt not tracking therapist with head or eyes to his left; with incr stretching and time, began to cross midline with eyes and head turn    Exercises        Assessment/Plan    PT Assessment Patient needs continued PT services  PT Diagnosis Difficulty walking;Generalized weakness;Altered mental status   PT Problem List Decreased strength;Decreased range of motion;Decreased balance;Decreased mobility;Decreased cognition;Decreased knowledge of use of DME;Decreased safety awareness;Impaired tone  PT Treatment Interventions DME instruction;Gait training;Functional mobility training;Therapeutic activities;Therapeutic exercise;Balance training;Neuromuscular re-education;Cognitive remediation;Patient/family education   PT Goals (Current goals can be found in the Care Plan section) Acute Rehab PT Goals Patient Stated Goal: wants to go home today PT Goal Formulation: With patient Time For Goal Achievement: 04/23/15 Potential to Achieve Goals: Good    Frequency Min 3X/week   Barriers to discharge   unclear if family can manage at home    Co-evaluation               End of Session Equipment Utilized During Treatment: Gait  belt;Oxygen Activity Tolerance: Patient tolerated treatment well Patient left: in chair;with call bell/phone within reach;with chair alarm set Nurse Communication: Mobility status;Need for lift equipment         Time: [phone] PT Time Calculation (min) (ACUTE ONLY): 33 min   Charges:   PT Evaluation $PT Eval High Complexity: 1 Procedure PT Treatments $Neuromuscular Re-education: 8-22 mins   PT G Codes:        Annelies Coyt 04-14-15, 10:00 AM Pager [phone]

## 2015-04-09 NOTE — Progress Notes (Signed)
MBS report now available under the imaging section of the notes  Mikenzi Raysor Sumney MA, CCC-SLP Acute Care Speech Language Pathologist    

## 2015-04-09 NOTE — Progress Notes (Signed)
PATIENT DETAILS Name: Kyle Morrow Age: 75 y.o. Sex: male Date of Birth: 1941/03/21 Admit Date: 04/03/2015 Admitting Physician Lupita Leash, MD ZOX:[id number],WUJWJXB M., MD  Subjective: Awake-but appears very weak,  Assessment/Plan: Active Problems: Acute hypoxemic respiratory failure: Intubated on admission given inability to protect airway-subsequently with aspiration pneumonia. Extubated on 1/12, doing relatively well on 3 L of oxygen via nasal cannula. Appears very weak and deconditioned.  Hypothermia: Secondary to cold exposure-resolved.  Aspiration pneumonia: Remains febrile-no leukocytosis-continue Unasyn.Blood culture 1/9 neg.  Dysphagia: Speech therapy following, plans are for a modified barium swallow. Keep nothing by mouth for now.  Mild hypernatremia: Gentle hydration, follow electrolytes  Acute encephalopathy: Secondary to hypothermia/pneumonia-Parkinson's/dementia. Mental status slowly improving-alert-somewhat confused this morning-but follows commands.  Thrombocytopenia: Resolved.  Hypertension:Continue labetalol  Parkinson's disease: Continue Sinemet.   Dementia: Not sure if related to Parkinson's disease, resume Aricept/Lexapro-after MBS is complete  Disposition: Remain inpatient  Antimicrobial agents  See below  Anti-infectives    Start     Dose/Rate Route Frequency Ordered Stop   04/06/15 1200  Ampicillin-Sulbactam (UNASYN) 3 g in sodium chloride 0.9 % 100 mL IVPB     3 g 100 mL/hr over 60 Minutes Intravenous Every 6 hours 04/06/15 1025 04/10/15 2359   04/04/15 1800  vancomycin (VANCOCIN) IVPB 1000 mg/200 mL premix  Status:  Discontinued     1,000 mg 200 mL/hr over 60 Minutes Intravenous Every 12 hours 04/04/15 0346 04/06/15 1025   04/04/15 0400  vancomycin (VANCOCIN) 1,500 mg in sodium chloride 0.9 % 500 mL IVPB     1,500 mg 250 mL/hr over 120 Minutes Intravenous  Once 04/04/15 0346 04/04/15 0611   04/04/15 0400   piperacillin-tazobactam (ZOSYN) IVPB 3.375 g  Status:  Discontinued     3.375 g 12.5 mL/hr over 240 Minutes Intravenous 3 times per day 04/04/15 0346 04/06/15 1025      DVT Prophylaxis: Prophylactic Heparin   Code Status: Partial code   Family Communication None at bedside  Procedures: ETT 1/08>>> Left rad 1/8>>>1/10  CONSULTS:  pulmonary/intensive care  Time spent 20 minutes-Greater than 50% of this time was spent in counseling, explanation of diagnosis, planning of further management, and coordination of care.  MEDICATIONS: Scheduled Meds: . ampicillin-sulbactam (UNASYN) IV  3 g Intravenous Q6H  . antiseptic oral rinse  7 mL Mouth Rinse q12n4p  . aspirin  325 mg Oral Daily  . bisacodyl  10 mg Rectal Once  . Carbidopa-Levodopa   Enteral Q1200  . chlorhexidine  15 mL Mouth Rinse BID  . heparin  5,000 Units Subcutaneous 3 times per day  . labetalol  100 mg Oral BID  . polyethylene glycol  17 g Oral Daily   Continuous Infusions: . sodium chloride Stopped (04/08/15 1500)  . feeding supplement (VITAL AF 1.2 CAL) Stopped (04/07/15 1000)   PRN Meds:.sodium chloride, acetaminophen (TYLENOL) oral liquid 160 mg/5 mL, bisacodyl **FOLLOWED BY** bisacodyl, fentaNYL (SUBLIMAZE) injection, hydrALAZINE, RESOURCE THICKENUP CLEAR    PHYSICAL EXAM: Vital signs in last 24 hours: Filed Vitals:   04/08/15 1541 04/08/15 2056 04/09/15 0529 04/09/15 1158  BP: 121/65 107/59 138/72 136/76  Pulse: 83 93 85 83  Temp: 97.6 F (36.4 C) 99.2 F (37.3 C) 99.4 F (37.4 C)   TempSrc: Oral Oral Oral   Resp: 16 20 19    Height:      Weight: 86 kg (189 lb 9.5 oz)     SpO2:  100% 97% 97%     Weight change: -0.1 kg (-3.5 oz) Filed Weights   04/07/15 0329 04/08/15 0330 04/08/15 1541  Weight: 85 kg (187 lb 6.3 oz) 86.1 kg (189 lb 13.1 oz) 86 kg (189 lb 9.5 oz)   Body mass index is 28.83 kg/(m^2).   Gen Exam: Awake and mostly alert with clear but slow speech. Looks very weak and  deconditioned Neck: Supple Chest: B/L Clear anteriorly CVS: S1 S2 Regular, no murmurs.  Abdomen: soft, BS +, non tender, non distended.  Extremities: no edema, lower extremities warm to touch. Neurologic: Non Focal-but with gen weakness  Skin: No Rash.   Wounds: N/A.    Intake/Output from previous day:  Intake/Output Summary (Last 24 hours) at 04/09/15 1337 Last data filed at 04/09/15 0400  Gross per 24 hour  Intake  237.5 ml  Output    350 ml  Net -112.5 ml     LAB RESULTS: CBC  Recent Labs Lab 04/03/15 1246 04/04/15 1023 04/05/15 0515 04/06/15 0231 04/09/15 0845  WBC 17.5* 10.6* 10.4 8.8 7.3  HGB 12.7* 10.9* 12.2* 11.5* 12.4*  HCT 39.0 33.0* 36.7* 34.3* 39.2  PLT 203 140* 119* 129* 168  MCV 84.4 82.9 83.8 84.1 86.9  MCH 27.5 27.4 27.9 28.2 27.5  MCHC 32.6 33.0 33.2 33.5 31.6  RDW 13.6 13.9 14.1 13.9 14.0  LYMPHSABS  --  1.3 1.4 1.8  --   MONOABS  --  0.8 0.5 0.6  --   EOSABS  --  0.0 0.0 0.1  --   BASOSABS  --  0.0 0.0 0.0  --     Chemistries   Recent Labs Lab 04/03/15 1940  04/05/15 0515 04/06/15 0231 04/07/15 0345 04/08/15 0420 04/09/15 0845  NA 144  < > 141 144 146* 145 146*  K 3.8  < > 3.6 3.3* 3.5 3.7 3.9  CL 116*  < > 111 111 110 107 108  CO2 22  < > 21* 27 29 27 27   GLUCOSE 89  < > 147* 124* 141* 120* 138*  BUN 27*  < > 15 14 9 12 19   CREATININE 1.06  < > 0.83 0.82 0.73 0.82 0.80  CALCIUM 7.8*  < > 7.7* 8.2* 8.8* 9.1 9.1  MG 2.1  --   --  2.0  --   --   --   < > = values in this interval not displayed.  CBG:  Recent Labs Lab 04/07/15 2359 04/08/15 0344 04/08/15 0733 04/08/15 1255 04/08/15 2109  GLUCAP 108* 108* 135* 122* 154*    GFR Estimated Creatinine Clearance: 87.7 mL/min (by C-G formula based on Cr of 0.8).  Coagulation profile  Recent Labs Lab 04/05/15 1100  INR 1.30    Cardiac Enzymes  Recent Labs Lab 04/03/15 1246  CKMB 23.1*    Invalid input(s): POCBNP No results for input(s): DDIMER in the last 72  hours. No results for input(s): HGBA1C in the last 72 hours. No results for input(s): CHOL, HDL, LDLCALC, TRIG, CHOLHDL, LDLDIRECT in the last 72 hours. No results for input(s): TSH, T4TOTAL, T3FREE, THYROIDAB in the last 72 hours.  Invalid input(s): FREET3 No results for input(s): VITAMINB12, FOLATE, FERRITIN, TIBC, IRON, RETICCTPCT in the last 72 hours. No results for input(s): LIPASE, AMYLASE in the last 72 hours.  Urine Studies No results for input(s): UHGB, CRYS in the last 72 hours.  Invalid input(s): UACOL, UAPR, USPG, UPH, UTP, UGL, UKET, UBIL, UNIT, UROB, ULEU, UEPI, UWBC, URBC, UBAC,  CAST, UCOM, BILUA  MICROBIOLOGY: Recent Results (from the past 240 hour(s))  MRSA PCR Screening     Status: None   Collection Time: 04/03/15  5:49 PM  Result Value Ref Range Status   MRSA by PCR NEGATIVE NEGATIVE Final    Comment:        The GeneXpert MRSA Assay (FDA approved for NASAL specimens only), is one component of a comprehensive MRSA colonization surveillance program. It is not intended to diagnose MRSA infection nor to guide or monitor treatment for MRSA infections.   Culture, blood (Routine X 2) w Reflex to ID Panel     Status: None (Preliminary result)   Collection Time: 04/04/15  4:30 AM  Result Value Ref Range Status   Specimen Description BLOOD LEFT ARM  Final   Special Requests BOTTLES DRAWN AEROBIC AND ANAEROBIC 5CC  Final   Culture NO GROWTH 4 DAYS  Final   Report Status PENDING  Incomplete  Culture, blood (Routine X 2) w Reflex to ID Panel     Status: None (Preliminary result)   Collection Time: 04/04/15  4:35 AM  Result Value Ref Range Status   Specimen Description BLOOD LEFT HAND  Final   Special Requests BOTTLES DRAWN AEROBIC AND ANAEROBIC 5CC  Final   Culture NO GROWTH 4 DAYS  Final   Report Status PENDING  Incomplete  Culture, respiratory (NON-Expectorated)     Status: None   Collection Time: 04/04/15  6:34 AM  Result Value Ref Range Status   Specimen  Description TRACHEAL ASPIRATE  Final   Special Requests NONE  Final   Gram Stain   Final    ABUNDANT WBC PRESENT,BOTH PMN AND MONONUCLEAR RARE SQUAMOUS EPITHELIAL CELLS PRESENT MODERATE GRAM POSITIVE COCCI IN PAIRS Performed at [facility]    Culture   Final    NORMAL OROPHARYNGEAL FLORA Performed at [facility]    Report Status 04/06/2015 FINAL  Final    RADIOLOGY STUDIES/RESULTS: Dg Elbow Complete Right  04/04/2015  CLINICAL DATA:  Intubated sedated patient with history of Parkinson's disease EXAM: RIGHT ELBOW - COMPLETE 3+ VIEW COMPARISON:  None in PACs FINDINGS: AP and lateral portable views of the elbow reveal no acute fracture or dislocation. There is mild soft tissue swelling over the olecranon and there is a tiny olecranon spur. A tiny bony irregularity along the posterior superior aspect of the olecranon is present. No joint effusion is evident. The radial head is intact. The distal humerus is unremarkable. IMPRESSION: Mild soft tissue swelling posteriorly may reflect olecranon bursitis with a small olecranon spur. A bony density along the extreme posterior superior aspect of the olecranon likely reflects an osteophyte though a tiny avulsion is not absolutely excluded. Correlation with any history of trauma is needed. The clinical history mentions a pressure ulcer although the side is not specified. Electronically Signed   By: David  [place] M.D.   On: 04/04/2015 07:46   Ct Head Wo Contrast  04/03/2015  CLINICAL DATA:  75 year old male found down outside EXAM: CT HEAD WITHOUT CONTRAST CT CERVICAL SPINE WITHOUT CONTRAST TECHNIQUE: Multidetector CT imaging of the head and cervical spine was performed following the standard protocol without intravenous contrast. Multiplanar CT image reconstructions of the cervical spine were also generated. COMPARISON:  None. FINDINGS: CT HEAD FINDINGS Negative for acute intracranial hemorrhage, acute infarction, mass, mass effect,  hydrocephalus or midline shift. Gray-white differentiation is preserved throughout. Mild cerebral cortical atrophy. Small well-defined low-attenuation focus in the right putaminal most consistent with  a remote lacunar infarct. No focal scalp contusion or hematoma. Globes and orbits are symmetric and intact bilaterally. No focal calvarial fracture. Partial opacification of several scattered right-sided ethmoid air cells. Atherosclerotic calcifications in both cavernous carotid arteries. CT CERVICAL SPINE FINDINGS Moderate image degradation secondary to patient motion. No acute fracture, malalignment or prevertebral soft tissue swelling. Multilevel cervical spondylosis. Mild (2 mm) degenerative anterolisthesis of C5 on C6. Right greater than left facet arthropathy. The patient is intubated and a nasogastric tube are present. Unremarkable CT appearance of the thyroid gland. No acute soft tissue abnormality. The lung apices are unremarkable. IMPRESSION: CT HEAD 1. No acute intracranial abnormality. 2. Cerebral cortical atrophy. 3. Remote right basal ganglia lacunar infarct. 4. Atherosclerotic vascular calcifications in the intracranial carotid arteries. CT CSPINE 1. No evidence of acute fracture or malalignment. 2. Multilevel cervical spondylosis including mild degenerative anterolisthesis of C5 on C6. 3. Right greater than left facet arthropathy. Electronically Signed   By: Malachy Moan M.D.   On: 04/03/2015 13:52   Ct Cervical Spine Wo Contrast  04/03/2015  CLINICAL DATA:  74 year old male found down outside EXAM: CT HEAD WITHOUT CONTRAST CT CERVICAL SPINE WITHOUT CONTRAST TECHNIQUE: Multidetector CT imaging of the head and cervical spine was performed following the standard protocol without intravenous contrast. Multiplanar CT image reconstructions of the cervical spine were also generated. COMPARISON:  None. FINDINGS: CT HEAD FINDINGS Negative for acute intracranial hemorrhage, acute infarction, mass, mass  effect, hydrocephalus or midline shift. Gray-white differentiation is preserved throughout. Mild cerebral cortical atrophy. Small well-defined low-attenuation focus in the right putaminal most consistent with a remote lacunar infarct. No focal scalp contusion or hematoma. Globes and orbits are symmetric and intact bilaterally. No focal calvarial fracture. Partial opacification of several scattered right-sided ethmoid air cells. Atherosclerotic calcifications in both cavernous carotid arteries. CT CERVICAL SPINE FINDINGS Moderate image degradation secondary to patient motion. No acute fracture, malalignment or prevertebral soft tissue swelling. Multilevel cervical spondylosis. Mild (2 mm) degenerative anterolisthesis of C5 on C6. Right greater than left facet arthropathy. The patient is intubated and a nasogastric tube are present. Unremarkable CT appearance of the thyroid gland. No acute soft tissue abnormality. The lung apices are unremarkable. IMPRESSION: CT HEAD 1. No acute intracranial abnormality. 2. Cerebral cortical atrophy. 3. Remote right basal ganglia lacunar infarct. 4. Atherosclerotic vascular calcifications in the intracranial carotid arteries. CT CSPINE 1. No evidence of acute fracture or malalignment. 2. Multilevel cervical spondylosis including mild degenerative anterolisthesis of C5 on C6. 3. Right greater than left facet arthropathy. Electronically Signed   By: Malachy Moan M.D.   On: 04/03/2015 13:52   Dg Pelvis Portable  04/03/2015  CLINICAL DATA:  Seizure EXAM: PORTABLE PELVIS 1-2 VIEWS COMPARISON:  None FINDINGS: There is a left hip arthroplasty device. Moderate degenerative changes are noted involving the right hip. Rectal temperature probe noted. IMPRESSION: 1. No acute findings. Electronically Signed   By: Signa Kell M.D.   On: 04/03/2015 13:03   Dg Chest Port 1 View  04/06/2015  CLINICAL DATA:  Acute respiratory failure, Parkinson's disease, seizure activity, remote history of  smoking. EXAM: PORTABLE CHEST 1 VIEW COMPARISON:  Portable chest x-ray of April 05, 2015 FINDINGS: The lungs are reasonably well inflated. There is bibasilar atelectasis greater on the right. No alveolar infiltrates are observed. The cardiac silhouette is mildly enlarged but stable. The central pulmonary vascularity is less prominent today. The endotracheal tube tip lies 4.8 cm above the carina. The esophagogastric tube tip projects below the  inferior margin of the image. The left internal jugular venous catheter tip projects over the midportion of the SVC. The bony thorax is unremarkable where visualized. IMPRESSION: Interval improvement in bibasilar atelectasis or pneumonia. Mild stable cardiomegaly and improved central pulmonary vascular prominence. The support tubes are in reasonable position. Electronically Signed   By: David  [place] M.D.   On: 04/06/2015 06:55   Dg Chest Port 1 View  04/05/2015  CLINICAL DATA:  Acute respiratory failure. EXAM: PORTABLE CHEST 1 VIEW COMPARISON:  04/04/2015. FINDINGS: Endotracheal tube, left IJ line, NG tube in stable position . Cardiomegaly with pulmonary venous congestion . Bilateral pulmonary infiltrates. Findings consistent congestive heart failure. Small bilateral pleural effusions. No pneumothorax . IMPRESSION: 1. Lines and tubes in stable position. 2. Cardiomegaly with pulmonary venous congestion and bilateral pulmonary infiltrates consistent congestive heart failure. Bilateral pneumonia cannot be excluded. Electronically Signed   By: Maisie Fushomas  Register   On: 04/05/2015 07:18   Dg Chest Port 1 View  04/04/2015  CLINICAL DATA:  Hypoxia. Recent seizure. Central catheter placement EXAM: PORTABLE CHEST 1 VIEW COMPARISON:  Study obtained earlier in the day FINDINGS: Endotracheal tube tip is 6.6 cm above the carina. Central catheter tip is in the superior vena cava. Nasogastric tube tip and side port are in the stomach. No pneumothorax. There are areas of patchy  consolidation in the right medial right base and left lower lobe regions. No new opacity. Heart is upper normal in size with pulmonary vascularity within normal limits. No adenopathy. No bone lesions. IMPRESSION: Stable patchy areas of consolidation bilaterally. No new opacity. Tube and catheter positions as described without pneumothorax. Electronically Signed   By: Bretta BangWilliam  Woodruff III M.D.   On: 04/04/2015 14:59   Dg Chest Port 1 View  04/04/2015  CLINICAL DATA:  Respiratory failure. EXAM: PORTABLE CHEST 1 VIEW COMPARISON:  04/03/2015 FINDINGS: New endotracheal tube and NG tube in stable position. Mediastinum hilar structures normal. Progressive bibasilar atelectasis and/or infiltrates. No pleural effusion or pneumothorax. IMPRESSION: 1. Endotracheal tube and NG tube in good anatomic position. 2. Progressive bibasilar atelectasis and/or infiltrates. Electronically Signed   By: Maisie Fushomas  Register   On: 04/04/2015 07:43   Dg Chest Portable 1 View  04/03/2015  CLINICAL DATA:  Seizure EXAM: PORTABLE CHEST 1 VIEW COMPARISON:  None FINDINGS: The ET tube tip is above the carina. There is a nasogastric tube with tip in the stomach. Normal heart size. No pleural effusion or edema. No airspace consolidation. IMPRESSION: 1. No acute cardiopulmonary abnormalities. 2. Satisfactory position of the support apparatus. Electronically Signed   By: Signa Kellaylor  Stroud M.D.   On: 04/03/2015 13:00    Jeoffrey MassedGHIMIRE,SHANKER, MD  [facility] Pager:[phone]  If 7PM-7AM, please contact night-coverage [email or web address] Password [email or web address] 04/09/2015, 1:37 PM   LOS: 6 days

## 2015-04-10 LAB — BASIC METABOLIC PANEL
Anion gap: 8 (ref 5–15)
BUN: 18 mg/dL (ref 6–20)
CALCIUM: 8.9 mg/dL (ref 8.9–10.3)
CO2: 27 mmol/L (ref 22–32)
Chloride: 111 mmol/L (ref 101–111)
Creatinine, Ser: 0.77 mg/dL (ref 0.61–1.24)
GFR calc Af Amer: >60 mL/min (ref >60)
GLUCOSE: 147 mg/dL — AB (ref 65–99)
Potassium: 3.7 mmol/L (ref 3.5–5.1)
Sodium: 146 mmol/L — ABNORMAL HIGH (ref 135–145)

## 2015-04-10 LAB — GLUCOSE, CAPILLARY
GLUCOSE-CAPILLARY: 123 mg/dL — AB (ref 65–99)
GLUCOSE-CAPILLARY: 96 mg/dL (ref 65–99)
Glucose-Capillary: 120 mg/dL — ABNORMAL HIGH (ref 65–99)
Glucose-Capillary: 124 mg/dL — ABNORMAL HIGH (ref 65–99)
Glucose-Capillary: 161 mg/dL — ABNORMAL HIGH (ref 65–99)
Glucose-Capillary: 172 mg/dL — ABNORMAL HIGH (ref 65–99)

## 2015-04-10 MED ORDER — DONEPEZIL HCL 5 MG PO TABS
5.0000 mg | ORAL_TABLET | Freq: Every day | ORAL | Status: DC
  Administered 2015-04-10 – 2015-04-13 (×4): 5 mg via ORAL
  Filled 2015-04-10 (×4): qty 1

## 2015-04-10 MED ORDER — CARBIDOPA-LEVODOPA 25-100 MG PO TABS
2.0000 | ORAL_TABLET | ORAL | Status: DC
  Administered 2015-04-11 – 2015-04-14 (×2): 2 via ORAL
  Filled 2015-04-10 (×2): qty 2

## 2015-04-10 MED ORDER — ESCITALOPRAM OXALATE 10 MG PO TABS
10.0000 mg | ORAL_TABLET | Freq: Every day | ORAL | Status: DC
  Administered 2015-04-10 – 2015-04-14 (×5): 10 mg via ORAL
  Filled 2015-04-10 (×5): qty 1

## 2015-04-10 MED ORDER — WHITE PETROLATUM GEL
Status: AC
  Administered 2015-04-10: 22:00:00
  Filled 2015-04-10: qty 1

## 2015-04-10 MED ORDER — CARBIDOPA-LEVODOPA 25-100 MG PO TABS: 2.0000 | ORAL_TABLET | Freq: Every day | ORAL | Status: DC

## 2015-04-10 NOTE — Progress Notes (Signed)
Rehab Admissions Coordinator Note:  Patient was screened by Trish MageLogue, Alonia Dibuono M for appropriateness for an Inpatient Acute Rehab Consult.  At this time, an inpatient rehab consult has been ordered and is pending completion.  We will follow up once consult is completed.   Trish MageLogue, Normon Pettijohn M 04/10/2015, 4:36 PM  I can be reached at [phone].

## 2015-04-10 NOTE — Progress Notes (Signed)
PATIENT DETAILS Name: Kyle Morrow Age: 75 y.o. Sex: male Date of Birth: 1940/10/14 Admit Date: 04/03/2015 Admitting Physician Lupita Leash, MD ZOX:[id number] M., MD  Subjective: Awake and alert-but very weak/deconditioned. Speech is very soft. Son at bedside.(patient recognized son)  Assessment/Plan: Active Problems: Acute hypoxemic respiratory failure: Intubated on admission given inability to protect airway-subsequently with aspiration pneumonia. Extubated on 1/12, doing relatively well on 3 L of oxygen via nasal cannula. Appears very weak and deconditioned.  Hypothermia: Secondary to cold exposure-resolved.  Aspiration pneumonia: Remains febrile-no leukocytosis-continue Unasyn. 1/15.Blood culture 1/9 neg.  Dysphagia: Speech therapy following, underwent modified barium swallow on 1/14-on dysphagia 1 diet. Spoke with patient's son at bedside-explained risk of aspiration-continue with dysphagia 1 diet for now.  Mild hypernatremia: Gentle hydration, follow electrolytes  Acute encephalopathy: Secondary to hypothermia/pneumonia-Parkinson's/dementia. Mental status slowly improving-alert-and suspect very close to usual baseline.   Thrombocytopenia: Resolved.  Hypertension:Continue labetalol  Parkinson's disease: Continue Sinemet.   Dementia: Not sure if related to Parkinson's disease, resume Aricept/Lexapro  Deconditioning: Appears very deconditioned-likely secondary to acute illness-await CIR evaluation  Disposition: Remain inpatient-CIR vs SNF on discharge  Antimicrobial agents  See below  Anti-infectives    Start     Dose/Rate Route Frequency Ordered Stop   04/06/15 1200  Ampicillin-Sulbactam (UNASYN) 3 g in sodium chloride 0.9 % 100 mL IVPB     3 g 100 mL/hr over 60 Minutes Intravenous Every 6 hours 04/06/15 1025 04/10/15 2359   04/04/15 1800  vancomycin (VANCOCIN) IVPB 1000 mg/200 mL premix  Status:  Discontinued     1,000 mg 200 mL/hr over  60 Minutes Intravenous Every 12 hours 04/04/15 0346 04/06/15 1025   04/04/15 0400  vancomycin (VANCOCIN) 1,500 mg in sodium chloride 0.9 % 500 mL IVPB     1,500 mg 250 mL/hr over 120 Minutes Intravenous  Once 04/04/15 0346 04/04/15 0611   04/04/15 0400  piperacillin-tazobactam (ZOSYN) IVPB 3.375 g  Status:  Discontinued     3.375 g 12.5 mL/hr over 240 Minutes Intravenous 3 times per day 04/04/15 0346 04/06/15 1025      DVT Prophylaxis: Prophylactic Heparin   Code Status: Partial code   Family Communication Son at bedside  Procedures: ETT 1/08>>> Left rad 1/8>>>1/10  CONSULTS:  pulmonary/intensive care  Time spent 20 minutes-Greater than 50% of this time was spent in counseling, explanation of diagnosis, planning of further management, and coordination of care.  MEDICATIONS: Scheduled Meds: . ampicillin-sulbactam (UNASYN) IV  3 g Intravenous Q6H  . antiseptic oral rinse  7 mL Mouth Rinse q12n4p  . aspirin  325 mg Oral Daily  . bisacodyl  10 mg Rectal Once  . Carbidopa-Levodopa   Enteral Q1200  . chlorhexidine  15 mL Mouth Rinse BID  . heparin  5,000 Units Subcutaneous 3 times per day  . labetalol  100 mg Oral BID  . polyethylene glycol  17 g Oral Daily   Continuous Infusions: . dextrose 5 % and 0.45% NaCl 50 mL/hr at 04/09/15 1537   PRN Meds:.acetaminophen (TYLENOL) oral liquid 160 mg/5 mL, hydrALAZINE, RESOURCE THICKENUP CLEAR    PHYSICAL EXAM: Vital signs in last 24 hours: Filed Vitals:   04/09/15 2117 04/10/15 0451 04/10/15 0545 04/10/15 1032  BP: 132/57  174/89 132/61  Pulse: 90  85 79  Temp: 99.3 F (37.4 C)  99.8 F (37.7 C)   TempSrc: Oral  Oral   Resp: 20  18  Height:      Weight:  87.3 kg (192 lb 7.4 oz)    SpO2: 94%  92%     Weight change: 1.3 kg (2 lb 13.9 oz) Filed Weights   04/08/15 0330 04/08/15 1541 04/10/15 0451  Weight: 86.1 kg (189 lb 13.1 oz) 86 kg (189 lb 9.5 oz) 87.3 kg (192 lb 7.4 oz)   Body mass index is 29.27 kg/(m^2).    Gen Exam: Awake and mostly alert with clear but slow speech. Looks very weak and deconditioned Neck: Supple Chest: B/L Clear anteriorly CVS: S1 S2 Regular, no murmurs.  Abdomen: soft, BS +, non tender, non distended.  Extremities: no edema, lower extremities warm to touch. Neurologic: Non Focal-but with gen weakness  Skin: No Rash.   Wounds: N/A.    Intake/Output from previous day:  Intake/Output Summary (Last 24 hours) at 04/10/15 1308 Last data filed at 04/10/15 1252  Gross per 24 hour  Intake 569.17 ml  Output    450 ml  Net 119.17 ml     LAB RESULTS: CBC  Recent Labs Lab 04/04/15 1023 04/05/15 0515 04/06/15 0231 04/09/15 0845  WBC 10.6* 10.4 8.8 7.3  HGB 10.9* 12.2* 11.5* 12.4*  HCT 33.0* 36.7* 34.3* 39.2  PLT 140* 119* 129* 168  MCV 82.9 83.8 84.1 86.9  MCH 27.4 27.9 28.2 27.5  MCHC 33.0 33.2 33.5 31.6  RDW 13.9 14.1 13.9 14.0  LYMPHSABS 1.3 1.4 1.8  --   MONOABS 0.8 0.5 0.6  --   EOSABS 0.0 0.0 0.1  --   BASOSABS 0.0 0.0 0.0  --     Chemistries   Recent Labs Lab 04/03/15 1940  04/06/15 0231 04/07/15 0345 04/08/15 0420 04/09/15 0845 04/10/15 0810  NA 144  < > 144 146* 145 146* 146*  K 3.8  < > 3.3* 3.5 3.7 3.9 3.7  CL 116*  < > 111 110 107 108 111  CO2 22  < > 27 29 27 27 27   GLUCOSE 89  < > 124* 141* 120* 138* 147*  BUN 27*  < > 14 9 12 19 18   CREATININE 1.06  < > 0.82 0.73 0.82 0.80 0.77  CALCIUM 7.8*  < > 8.2* 8.8* 9.1 9.1 8.9  MG 2.1  --  2.0  --   --   --   --   < > = values in this interval not displayed.  CBG:  Recent Labs Lab 04/09/15 2038 04/10/15 0010 04/10/15 0543 04/10/15 0747 04/10/15 1145  GLUCAP 147* 120* 123* 124* 172*    GFR Estimated Creatinine Clearance: 88.4 mL/min (by C-G formula based on Cr of 0.77).  Coagulation profile  Recent Labs Lab 04/05/15 1100  INR 1.30    Cardiac Enzymes No results for input(s): CKMB, TROPONINI, MYOGLOBIN in the last 168 hours.  Invalid input(s): CK  Invalid input(s):  POCBNP No results for input(s): DDIMER in the last 72 hours. No results for input(s): HGBA1C in the last 72 hours. No results for input(s): CHOL, HDL, LDLCALC, TRIG, CHOLHDL, LDLDIRECT in the last 72 hours. No results for input(s): TSH, T4TOTAL, T3FREE, THYROIDAB in the last 72 hours.  Invalid input(s): FREET3 No results for input(s): VITAMINB12, FOLATE, FERRITIN, TIBC, IRON, RETICCTPCT in the last 72 hours. No results for input(s): LIPASE, AMYLASE in the last 72 hours.  Urine Studies No results for input(s): UHGB, CRYS in the last 72 hours.  Invalid input(s): UACOL, UAPR, USPG, UPH, UTP, UGL, UKET, UBIL, UNIT, UROB, ULEU, UEPI, UWBC,  URBC, UBAC, CAST, UCOM, BILUA  MICROBIOLOGY: Recent Results (from the past 240 hour(s))  MRSA PCR Screening     Status: None   Collection Time: 04/03/15  5:49 PM  Result Value Ref Range Status   MRSA by PCR NEGATIVE NEGATIVE Final    Comment:        The GeneXpert MRSA Assay (FDA approved for NASAL specimens only), is one component of a comprehensive MRSA colonization surveillance program. It is not intended to diagnose MRSA infection nor to guide or monitor treatment for MRSA infections.   Culture, blood (Routine X 2) w Reflex to ID Panel     Status: None   Collection Time: 04/04/15  4:30 AM  Result Value Ref Range Status   Specimen Description BLOOD LEFT ARM  Final   Special Requests BOTTLES DRAWN AEROBIC AND ANAEROBIC 5CC  Final   Culture NO GROWTH 5 DAYS  Final   Report Status 04/09/2015 FINAL  Final  Culture, blood (Routine X 2) w Reflex to ID Panel     Status: None   Collection Time: 04/04/15  4:35 AM  Result Value Ref Range Status   Specimen Description BLOOD LEFT HAND  Final   Special Requests BOTTLES DRAWN AEROBIC AND ANAEROBIC 5CC  Final   Culture NO GROWTH 5 DAYS  Final   Report Status 04/09/2015 FINAL  Final  Culture, respiratory (NON-Expectorated)     Status: None   Collection Time: 04/04/15  6:34 AM  Result Value Ref Range  Status   Specimen Description TRACHEAL ASPIRATE  Final   Special Requests NONE  Final   Gram Stain   Final    ABUNDANT WBC PRESENT,BOTH PMN AND MONONUCLEAR RARE SQUAMOUS EPITHELIAL CELLS PRESENT MODERATE GRAM POSITIVE COCCI IN PAIRS Performed at [facility]    Culture   Final    NORMAL OROPHARYNGEAL FLORA Performed at [facility]    Report Status 04/06/2015 FINAL  Final    RADIOLOGY STUDIES/RESULTS: Dg Elbow Complete Right  04/04/2015  CLINICAL DATA:  Intubated sedated patient with history of Parkinson's disease EXAM: RIGHT ELBOW - COMPLETE 3+ VIEW COMPARISON:  None in PACs FINDINGS: AP and lateral portable views of the elbow reveal no acute fracture or dislocation. There is mild soft tissue swelling over the olecranon and there is a tiny olecranon spur. A tiny bony irregularity along the posterior superior aspect of the olecranon is present. No joint effusion is evident. The radial head is intact. The distal humerus is unremarkable. IMPRESSION: Mild soft tissue swelling posteriorly may reflect olecranon bursitis with a small olecranon spur. A bony density along the extreme posterior superior aspect of the olecranon likely reflects an osteophyte though a tiny avulsion is not absolutely excluded. Correlation with any history of trauma is needed. The clinical history mentions a pressure ulcer although the side is not specified. Electronically Signed   By: David  [place] M.D.   On: 04/04/2015 07:46   Ct Head Wo Contrast  04/03/2015  CLINICAL DATA:  75 year old male found down outside EXAM: CT HEAD WITHOUT CONTRAST CT CERVICAL SPINE WITHOUT CONTRAST TECHNIQUE: Multidetector CT imaging of the head and cervical spine was performed following the standard protocol without intravenous contrast. Multiplanar CT image reconstructions of the cervical spine were also generated. COMPARISON:  None. FINDINGS: CT HEAD FINDINGS Negative for acute intracranial hemorrhage, acute infarction, mass,  mass effect, hydrocephalus or midline shift. Gray-white differentiation is preserved throughout. Mild cerebral cortical atrophy. Small well-defined low-attenuation focus in the right putaminal most consistent with  a remote lacunar infarct. No focal scalp contusion or hematoma. Globes and orbits are symmetric and intact bilaterally. No focal calvarial fracture. Partial opacification of several scattered right-sided ethmoid air cells. Atherosclerotic calcifications in both cavernous carotid arteries. CT CERVICAL SPINE FINDINGS Moderate image degradation secondary to patient motion. No acute fracture, malalignment or prevertebral soft tissue swelling. Multilevel cervical spondylosis. Mild (2 mm) degenerative anterolisthesis of C5 on C6. Right greater than left facet arthropathy. The patient is intubated and a nasogastric tube are present. Unremarkable CT appearance of the thyroid gland. No acute soft tissue abnormality. The lung apices are unremarkable. IMPRESSION: CT HEAD 1. No acute intracranial abnormality. 2. Cerebral cortical atrophy. 3. Remote right basal ganglia lacunar infarct. 4. Atherosclerotic vascular calcifications in the intracranial carotid arteries. CT CSPINE 1. No evidence of acute fracture or malalignment. 2. Multilevel cervical spondylosis including mild degenerative anterolisthesis of C5 on C6. 3. Right greater than left facet arthropathy. Electronically Signed   By: Malachy Moan M.D.   On: 04/03/2015 13:52   Ct Cervical Spine Wo Contrast  04/03/2015  CLINICAL DATA:  75 year old male found down outside EXAM: CT HEAD WITHOUT CONTRAST CT CERVICAL SPINE WITHOUT CONTRAST TECHNIQUE: Multidetector CT imaging of the head and cervical spine was performed following the standard protocol without intravenous contrast. Multiplanar CT image reconstructions of the cervical spine were also generated. COMPARISON:  None. FINDINGS: CT HEAD FINDINGS Negative for acute intracranial hemorrhage, acute infarction,  mass, mass effect, hydrocephalus or midline shift. Gray-white differentiation is preserved throughout. Mild cerebral cortical atrophy. Small well-defined low-attenuation focus in the right putaminal most consistent with a remote lacunar infarct. No focal scalp contusion or hematoma. Globes and orbits are symmetric and intact bilaterally. No focal calvarial fracture. Partial opacification of several scattered right-sided ethmoid air cells. Atherosclerotic calcifications in both cavernous carotid arteries. CT CERVICAL SPINE FINDINGS Moderate image degradation secondary to patient motion. No acute fracture, malalignment or prevertebral soft tissue swelling. Multilevel cervical spondylosis. Mild (2 mm) degenerative anterolisthesis of C5 on C6. Right greater than left facet arthropathy. The patient is intubated and a nasogastric tube are present. Unremarkable CT appearance of the thyroid gland. No acute soft tissue abnormality. The lung apices are unremarkable. IMPRESSION: CT HEAD 1. No acute intracranial abnormality. 2. Cerebral cortical atrophy. 3. Remote right basal ganglia lacunar infarct. 4. Atherosclerotic vascular calcifications in the intracranial carotid arteries. CT CSPINE 1. No evidence of acute fracture or malalignment. 2. Multilevel cervical spondylosis including mild degenerative anterolisthesis of C5 on C6. 3. Right greater than left facet arthropathy. Electronically Signed   By: Malachy Moan M.D.   On: 04/03/2015 13:52   Dg Pelvis Portable  04/03/2015  CLINICAL DATA:  Seizure EXAM: PORTABLE PELVIS 1-2 VIEWS COMPARISON:  None FINDINGS: There is a left hip arthroplasty device. Moderate degenerative changes are noted involving the right hip. Rectal temperature probe noted. IMPRESSION: 1. No acute findings. Electronically Signed   By: Signa Kell M.D.   On: 04/03/2015 13:03   Dg Chest Port 1 View  04/06/2015  CLINICAL DATA:  Acute respiratory failure, Parkinson's disease, seizure activity, remote  history of smoking. EXAM: PORTABLE CHEST 1 VIEW COMPARISON:  Portable chest x-ray of April 05, 2015 FINDINGS: The lungs are reasonably well inflated. There is bibasilar atelectasis greater on the right. No alveolar infiltrates are observed. The cardiac silhouette is mildly enlarged but stable. The central pulmonary vascularity is less prominent today. The endotracheal tube tip lies 4.8 cm above the carina. The esophagogastric tube tip projects below the  inferior margin of the image. The left internal jugular venous catheter tip projects over the midportion of the SVC. The bony thorax is unremarkable where visualized. IMPRESSION: Interval improvement in bibasilar atelectasis or pneumonia. Mild stable cardiomegaly and improved central pulmonary vascular prominence. The support tubes are in reasonable position. Electronically Signed   By: David  [place] M.D.   On: 04/06/2015 06:55   Dg Chest Port 1 View  04/05/2015  CLINICAL DATA:  Acute respiratory failure. EXAM: PORTABLE CHEST 1 VIEW COMPARISON:  04/04/2015. FINDINGS: Endotracheal tube, left IJ line, NG tube in stable position . Cardiomegaly with pulmonary venous congestion . Bilateral pulmonary infiltrates. Findings consistent congestive heart failure. Small bilateral pleural effusions. No pneumothorax . IMPRESSION: 1. Lines and tubes in stable position. 2. Cardiomegaly with pulmonary venous congestion and bilateral pulmonary infiltrates consistent congestive heart failure. Bilateral pneumonia cannot be excluded. Electronically Signed   By: Maisie Fushomas  Register   On: 04/05/2015 07:18   Dg Chest Port 1 View  04/04/2015  CLINICAL DATA:  Hypoxia. Recent seizure. Central catheter placement EXAM: PORTABLE CHEST 1 VIEW COMPARISON:  Study obtained earlier in the day FINDINGS: Endotracheal tube tip is 6.6 cm above the carina. Central catheter tip is in the superior vena cava. Nasogastric tube tip and side port are in the stomach. No pneumothorax. There are areas of patchy  consolidation in the right medial right base and left lower lobe regions. No new opacity. Heart is upper normal in size with pulmonary vascularity within normal limits. No adenopathy. No bone lesions. IMPRESSION: Stable patchy areas of consolidation bilaterally. No new opacity. Tube and catheter positions as described without pneumothorax. Electronically Signed   By: Bretta BangWilliam  Woodruff III M.D.   On: 04/04/2015 14:59   Dg Chest Port 1 View  04/04/2015  CLINICAL DATA:  Respiratory failure. EXAM: PORTABLE CHEST 1 VIEW COMPARISON:  04/03/2015 FINDINGS: New endotracheal tube and NG tube in stable position. Mediastinum hilar structures normal. Progressive bibasilar atelectasis and/or infiltrates. No pleural effusion or pneumothorax. IMPRESSION: 1. Endotracheal tube and NG tube in good anatomic position. 2. Progressive bibasilar atelectasis and/or infiltrates. Electronically Signed   By: Maisie Fushomas  Register   On: 04/04/2015 07:43   Dg Chest Portable 1 View  04/03/2015  CLINICAL DATA:  Seizure EXAM: PORTABLE CHEST 1 VIEW COMPARISON:  None FINDINGS: The ET tube tip is above the carina. There is a nasogastric tube with tip in the stomach. Normal heart size. No pleural effusion or edema. No airspace consolidation. IMPRESSION: 1. No acute cardiopulmonary abnormalities. 2. Satisfactory position of the support apparatus. Electronically Signed   By: Signa Kellaylor  Stroud M.D.   On: 04/03/2015 13:00   Dg Swallowing Func-speech Pathology  04/09/2015  Objective Swallowing Evaluation:   Patient Details Name: Kyle Morrow MRN: [id number] Date of Birth: 12/30/1940 Today's Date: 04/09/2015 Time: SLP Start Time (ACUTE ONLY): 1400-SLP Stop Time (ACUTE ONLY): 1417 SLP Time Calculation (min) (ACUTE ONLY): 17 min Past Medical History: @PMH @ Past Surgical History: Past Surgical History Procedure Laterality Date . Hip surgery     Left HPI: 75 y.o.male with PMH of Parkinson's/dementia dependent on family for ADL's. Last seen at 2 AM and then found  out in the cold by family around 8AM. Brought to ER with core temp 23, seizing. Intubated on 04/04/15, extubated 04/07/15.  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  Delay in swallow initiation observed with thin and nectar thick liquids to the level of the pyriform sinuses. Silent aspiration evidenced with thin liquids via straw sip. Pt with inconsistent deep penetration with thin liquids via cup sip, which increased when pt consumed larger bolus sizes. No aspiration with nectar thick liquids, however intermittent penetration was observed yet cleared with pts reflexive swallow. Pt with sensed deep penetration of mechanical soft consistencies that also cleared with cueing for hard cough and additonal swallow. Pharyngeal residuals noted to increase with mechanical soft textures, however liquid wash down was effective in decreasing residual amount. Given hx of aspiration PNA, reduced mentation, and decreased respiratory status recommend conservative diet implementation of dysphagia 1 (puree) and nectar thick liquids. Attempted whole barium tablet in puree during MBS, which pt chewed. Recommend medicines crushed in puree. Full supervision and feeding assistance warranted with all PO. ST to follow up and clinically upgrade diet as tolerated.   Impact on safety and function Mild aspiration risk;Moderate aspiration risk   CHL IP TREATMENT RECOMMENDATION 04/09/2015 Treatment Recommendations Therapy as outlined in treatment plan below   Prognosis 04/09/2015 Prognosis for Safe Diet Advancement Fair Barriers to Reach Goals Time post onset Barriers/Prognosis Comment -- CHL IP DIET RECOMMENDATION 04/09/2015 SLP Diet Recommendations Dysphagia 1 (Puree) solids;Nectar thick liquid Liquid Administration via Cup  Medication Administration Crushed with puree Compensations Minimize environmental distractions;Slow rate;Small sips/bites;Follow solids with liquid Postural Changes Remain semi-upright after after feeds/meals (Comment);Seated upright at 90 degrees   CHL IP OTHER RECOMMENDATIONS 04/09/2015 Recommended Consults -- Oral Care Recommendations Oral care BID Other Recommendations Order thickener from pharmacy   CHL IP FOLLOW UP RECOMMENDATIONS 04/09/2015 Follow up Recommendations 24 hour supervision/assistance   CHL IP FREQUENCY AND DURATION 04/09/2015 Speech Therapy Frequency (ACUTE ONLY) min 2x/week Treatment Duration 2 weeks      CHL IP ORAL PHASE 04/09/2015 Oral Phase Impaired Oral - Pudding Teaspoon -- Oral - Pudding Cup -- Oral - Honey Teaspoon -- Oral - Honey Cup -- Oral - Nectar Teaspoon -- Oral - Nectar Cup Weak lingual manipulation;Right anterior bolus loss;Lingual/palatal residue Oral - Nectar Straw -- Oral - Thin Teaspoon Weak lingual manipulation;Lingual/palatal residue Oral - Thin Cup Lingual/palatal residue;Weak lingual manipulation Oral - Thin Straw Weak lingual manipulation;Lingual/palatal residue Oral - Puree Weak lingual manipulation Oral - Mech Soft -- Oral - Regular Weak lingual manipulation;Impaired mastication;Piecemeal swallowing;Lingual/palatal residue;Decreased bolus cohesion Oral - Multi-Consistency -- Oral - Pill Impaired mastication;Weak lingual manipulation;Piecemeal swallowing;Lingual/palatal residue;Decreased bolus cohesion Oral Phase - Comment --  CHL IP PHARYNGEAL PHASE 04/09/2015 Pharyngeal Phase Impaired Pharyngeal- Pudding Teaspoon -- Pharyngeal -- Pharyngeal- Pudding Cup -- Pharyngeal -- Pharyngeal- Honey Teaspoon -- Pharyngeal -- Pharyngeal- Honey Cup -- Pharyngeal -- Pharyngeal- Nectar Teaspoon -- Pharyngeal -- Pharyngeal- Nectar Cup Delayed swallow initiation-pyriform sinuses;Penetration/Aspiration during swallow;Pharyngeal residue - valleculae;Pharyngeal residue - pyriform Pharyngeal  Material enters airway, CONTACTS cords and then ejected out Pharyngeal- Nectar Straw -- Pharyngeal -- Pharyngeal- Thin Teaspoon Delayed swallow initiation-pyriform sinuses;Pharyngeal residue - valleculae;Pharyngeal residue - pyriform Pharyngeal -- Pharyngeal- Thin Cup Penetration/Aspiration during swallow;Delayed swallow initiation-pyriform sinuses;Pharyngeal residue - valleculae;Pharyngeal residue - pyriform Pharyngeal Material enters airway, CONTACTS cords and not ejected out Pharyngeal- Thin Straw Penetration/Aspiration before swallow;Pharyngeal residue - valleculae;Pharyngeal residue - pyriform;Delayed swallow initiation-pyriform sinuses;Reduced airway/laryngeal closure Pharyngeal Material enters airway, passes BELOW cords without attempt by patient to eject out (silent aspiration) Pharyngeal- Puree Delayed swallow initiation-vallecula;Pharyngeal residue - pyriform;Pharyngeal residue - valleculae Pharyngeal -- Pharyngeal- Mechanical Soft -- Pharyngeal -- Pharyngeal- Regular Penetration/Aspiration during swallow;Delayed swallow initiation-vallecula;Pharyngeal residue -  valleculae Pharyngeal Material enters airway, CONTACTS cords and then ejected out Pharyngeal- Multi-consistency -- Pharyngeal -- Pharyngeal- Pill Delayed swallow initiation-vallecula;Pharyngeal residue - valleculae Pharyngeal -- Pharyngeal Comment --  No flowsheet data found. No flowsheet data found. Marcene Duos MA, CCC-SLP Acute Care Speech Language Pathologist  Kennieth Rad 04/09/2015, 2:52 PM               Jeoffrey Massed, MD  [facility] Pager:[phone]  If 7PM-7AM, please contact night-coverage [email or web address] Password [email or web address] 04/10/2015, 1:08 PM   LOS: 7 days

## 2015-04-11 DIAGNOSIS — G9340 Encephalopathy, unspecified: Secondary | ICD-10-CM

## 2015-04-11 DIAGNOSIS — R1310 Dysphagia, unspecified: Secondary | ICD-10-CM

## 2015-04-11 DIAGNOSIS — D62 Acute posthemorrhagic anemia: Secondary | ICD-10-CM

## 2015-04-11 DIAGNOSIS — R739 Hyperglycemia, unspecified: Secondary | ICD-10-CM | POA: Insufficient documentation

## 2015-04-11 DIAGNOSIS — E870 Hyperosmolality and hypernatremia: Secondary | ICD-10-CM | POA: Insufficient documentation

## 2015-04-11 LAB — GLUCOSE, CAPILLARY
GLUCOSE-CAPILLARY: 132 mg/dL — AB (ref 65–99)
GLUCOSE-CAPILLARY: 109 mg/dL — AB (ref 65–99)
Glucose-Capillary: 113 mg/dL — ABNORMAL HIGH (ref 65–99)
Glucose-Capillary: 138 mg/dL — ABNORMAL HIGH (ref 65–99)
Glucose-Capillary: 153 mg/dL — ABNORMAL HIGH (ref 65–99)
Glucose-Capillary: 163 mg/dL — ABNORMAL HIGH (ref 65–99)

## 2015-04-11 LAB — BASIC METABOLIC PANEL
Anion gap: 6 (ref 5–15)
BUN: 19 mg/dL (ref 6–20)
CHLORIDE: 115 mmol/L — AB (ref 101–111)
CO2: 25 mmol/L (ref 22–32)
CREATININE: 0.74 mg/dL (ref 0.61–1.24)
Calcium: 8.8 mg/dL — ABNORMAL LOW (ref 8.9–10.3)
GFR calc non Af Amer: >60 mL/min (ref >60)
Glucose, Bld: 142 mg/dL — ABNORMAL HIGH (ref 65–99)
Potassium: 3.6 mmol/L (ref 3.5–5.1)
Sodium: 146 mmol/L — ABNORMAL HIGH (ref 135–145)

## 2015-04-11 NOTE — Progress Notes (Signed)
Admissions coordinator to follow up for possible IP Rehab pending readiness, insurance approval and bed availability.    Weldon PickingSusan Rome Echavarria PT Inpatient Rehab Admissions Coordinator Cell [phone] Office [phone]

## 2015-04-11 NOTE — Consult Note (Signed)
Physical Medicine and Rehabilitation Consult Reason for Consult: Debilitation/acute hypoxemic respiratory failure/acute encephalopathy/Parkinson's disease Referring Physician: [facility]   HPI: Kyle Morrow is a 75 y.o. right handed male with history of Parkinson's disease followed at [facility]/dementia maintained on Sinemet and has a PEG tube for administration of carbidopa. By report patient lives with son who can provide assistance as needed. Using a single-point cane and was relatively independent prior to admission. One level home with 3 steps to entry.. Admitted 04/03/2015 after being found out in the cold by family. Core temperature 23. Admitted for hypothermia and intubated. CT of the head with no acute changes. Remote right basal ganglial lacunar infarct. CT C-spine negative. Initial chest x-ray no acute abnormalities. EEG showed slowing of cerebral activity no seizure noted. Critical care follow-up remained intubated until 04/07/2015. Subcutaneous heparin for DVT prophylaxis. Follow-up speech therapy presently on a dysphagia #1 nectar thick liquid diet. Intravenous Unasyn for suspected aspiration pneumonia. Blood cultures negative. Physical therapy evaluation completed 04/09/2015 with recommendations of physical medicine rehabilitation consult.   Review of Systems  Unable to perform ROS: mental acuity   Past Medical History  Diagnosis Date  . Parkinson disease [facility])    Past Surgical History  Procedure Laterality Date  . Hip surgery      Left   No family history on file. Social History:  reports that he quit smoking about 47 years ago. He does not have any smokeless tobacco history on file. He reports that he does not drink alcohol or use illicit drugs. Allergies: No Known Allergies Medications Prior to Admission  Medication Sig Dispense Refill  . aspirin EC 325 MG tablet Take 325 mg by mouth daily as needed (pain).    . Carbidopa-Levodopa (DUOPA EN) by Enteral route  See admin instructions. Pump administers 4.63 mg/20 mg per ml - connect to portal every morning between 9am & 11am, disconnect 12-13 hours later. Rate is 3.4 mL/hr verified on pump.    . carbidopa-levodopa (SINEMET IR) 25-100 MG tablet Take 2 tablets by mouth See admin instructions. Take 2 tablets by mouth when Duopa pump is removed each evening    . donepezil (ARICEPT) 5 MG tablet Take 5 mg by mouth at bedtime.    Marland Kitchen escitalopram (LEXAPRO) 10 MG tablet Take 10 mg by mouth daily.      Home: Home Living Family/patient expects to be discharged to:: Unsure Living Arrangements: Spouse/significant other, Children Home Equipment: Cane - single point Additional Comments: pt unable to provide details  Functional History: Prior Function Level of Independence: Needs assistance Gait / Transfers Assistance Needed: pt reports he walks with a cane sometimes Comments: no family present Functional Status:  Mobility: Bed Mobility Overal bed mobility: Needs Assistance, +2 for physical assistance Bed Mobility: Rolling, Sidelying to Sit Rolling: Max assist, +2 for physical assistance, +2 for safety/equipment Sidelying to sit: Max assist, +2 for physical assistance, +2 for safety/equipment General bed mobility comments: Able to assist with flexing legs and maintaining while rolling, reaches with UE towards rail; total assist for legs over EOB, however does use UEs to assist to raise torso; total assist to scoot to EOB in sitting Transfers Overall transfer level: Needs assistance Equipment used: 2 person hand held assist Transfers: Sit to/from Stand, Stand Pivot Transfers Sit to Stand: Mod assist, +2 physical assistance, +2 safety/equipment, From elevated surface Stand pivot transfers: Max assist, +2 physical assistance, +2 safety/equipment, From elevated surface General transfer comment: initiates standing with multi-modal cues; use of  pad under pelvis to elevate pelvis; unable to achieve foot-flat due to  decr bil DF; full assist to shift Rt and Lt to advance each LE during pivot Ambulation/Gait General Gait Details: unable    ADL:    Cognition: Cognition Overall Cognitive Status: No family/caregiver present to determine baseline cognitive functioning (disoriented; ?confabulation (story re: dtr-in-law hospitaliz) Orientation Level: Oriented to person, Oriented to place, Oriented to time Cognition Arousal/Alertness: Awake/alert Behavior During Therapy: Flat affect Overall Cognitive Status: No family/caregiver present to determine baseline cognitive functioning (disoriented; ?confabulation (story re: dtr-in-law hospitaliz) Memory: Decreased short-term memory (documented h/o dementia)  Blood pressure 141/98, pulse 77, temperature 98.6 F (37 C), temperature source Oral, resp. rate 18, height 5\' 8"  (1.727 m), weight 87 kg (191 lb 12.8 oz), SpO2 95 %. Physical Exam  Vitals reviewed. Constitutional: No distress.  75 year old frail Caucasian male  HENT:  Head: Normocephalic and atraumatic.  Eyes: Conjunctivae and EOM are normal.  Neck: Normal range of motion. Neck supple. No thyromegaly present.  Cardiovascular: Normal rate and regular rhythm.   Respiratory: Effort normal and breath sounds normal. No respiratory distress.  +Cook  GI: Soft. Bowel sounds are normal. He exhibits no distension.  Neurological: He has normal reflexes.  Lethargic but arousable.  A&Ox1 Sensation intact to light touch Motor: 4/5 proximally, 5/5 distally  Skin: Skin is warm and dry.  Scattered abrasions  Psychiatric: His affect is blunt. His speech is delayed and tangential. He is slowed. Cognition and memory are impaired. He expresses inappropriate judgment.    Results for orders placed or performed during the hospital encounter of 04/03/15 (from the past 24 hour(s))  Glucose, capillary     Status: Abnormal   Collection Time: 04/10/15  7:47 AM  Result Value Ref Range   Glucose-Capillary 124 (H) 65 - 99  mg/dL  Basic metabolic panel     Status: Abnormal   Collection Time: 04/10/15  8:10 AM  Result Value Ref Range   Sodium 146 (H) 135 - 145 mmol/L   Potassium 3.7 3.5 - 5.1 mmol/L   Chloride 111 101 - 111 mmol/L   CO2 27 22 - 32 mmol/L   Glucose, Bld 147 (H) 65 - 99 mg/dL   BUN 18 6 - 20 mg/dL   Creatinine, Ser 1.61 0.61 - 1.24 mg/dL   Calcium 8.9 8.9 - 09.6 mg/dL   GFR calc non Af Amer >60 >60 mL/min   GFR calc Af Amer >60 >60 mL/min   Anion gap 8 5 - 15  Glucose, capillary     Status: Abnormal   Collection Time: 04/10/15 11:45 AM  Result Value Ref Range   Glucose-Capillary 172 (H) 65 - 99 mg/dL  Glucose, capillary     Status: None   Collection Time: 04/10/15  5:48 PM  Result Value Ref Range   Glucose-Capillary 96 65 - 99 mg/dL  Glucose, capillary     Status: Abnormal   Collection Time: 04/10/15  8:09 PM  Result Value Ref Range   Glucose-Capillary 161 (H) 65 - 99 mg/dL   Comment 1 Notify RN    Comment 2 Document in Chart   Glucose, capillary     Status: Abnormal   Collection Time: 04/11/15 12:05 AM  Result Value Ref Range   Glucose-Capillary 113 (H) 65 - 99 mg/dL   Comment 1 Notify RN    Comment 2 Document in Chart   Glucose, capillary     Status: Abnormal   Collection Time: 04/11/15  5:06 AM  Result Value Ref Range   Glucose-Capillary 132 (H) 65 - 99 mg/dL   Comment 1 Notify RN    Comment 2 Document in Chart    Dg Swallowing Func-speech Pathology  04/09/2015  Objective Swallowing Evaluation:   Patient Details Name: Kyle Morrow MRN: [id number] Date of Birth: Mar 15, 1941 Today's Date: 04/09/2015 Time: SLP Start Time (ACUTE ONLY): 1400-SLP Stop Time (ACUTE ONLY): 1417 SLP Time Calculation (min) (ACUTE ONLY): 17 min Past Medical History: @PMH @ Past Surgical History: Past Surgical History Procedure Laterality Date . Hip surgery     Left HPI: 75 y.o.male with PMH of Parkinson's/dementia dependent on family for ADL's. Last seen at 2 AM and then found out in the cold by family around  8AM. Brought to ER with core temp 23, seizing. Intubated on 04/04/15, extubated 04/07/15.  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 Delay in swallow initiation observed with thin and nectar thick liquids to the level of the pyriform sinuses. Silent aspiration evidenced with thin liquids via straw sip. Pt with inconsistent deep penetration with thin liquids via cup sip, which increased when pt consumed larger bolus sizes. No aspiration with nectar thick liquids, however intermittent penetration was observed yet cleared with pts reflexive swallow. Pt with sensed deep penetration of mechanical soft consistencies that also cleared with cueing for hard cough and additonal swallow. Pharyngeal residuals noted to increase with mechanical soft textures, however liquid wash down was effective in decreasing residual amount. Given hx of aspiration PNA, reduced mentation, and decreased respiratory status recommend conservative diet implementation of dysphagia 1 (puree) and nectar thick liquids. Attempted whole barium tablet in puree during MBS, which pt chewed. Recommend medicines crushed in puree. Full supervision and feeding assistance warranted with all PO. ST to follow up and clinically upgrade diet as tolerated.   Impact on safety and function Mild aspiration risk;Moderate aspiration risk   CHL IP TREATMENT RECOMMENDATION 04/09/2015 Treatment Recommendations Therapy as outlined in treatment plan below   Prognosis 04/09/2015 Prognosis for Safe Diet Advancement Fair Barriers to Reach Goals Time post onset Barriers/Prognosis Comment -- CHL IP DIET RECOMMENDATION 04/09/2015 SLP Diet Recommendations Dysphagia 1 (Puree) solids;Nectar thick liquid Liquid Administration via Cup Medication Administration Crushed  with puree Compensations Minimize environmental distractions;Slow rate;Small sips/bites;Follow solids with liquid Postural Changes Remain semi-upright after after feeds/meals (Comment);Seated upright at 90 degrees   CHL IP OTHER RECOMMENDATIONS 04/09/2015 Recommended Consults -- Oral Care Recommendations Oral care BID Other Recommendations Order thickener from pharmacy   CHL IP FOLLOW UP RECOMMENDATIONS 04/09/2015 Follow up Recommendations 24 hour supervision/assistance   CHL IP FREQUENCY AND DURATION 04/09/2015 Speech Therapy Frequency (ACUTE ONLY) min 2x/week Treatment Duration 2 weeks      CHL IP ORAL PHASE 04/09/2015 Oral Phase Impaired Oral - Pudding Teaspoon -- Oral - Pudding Cup -- Oral - Honey Teaspoon -- Oral - Honey Cup -- Oral - Nectar Teaspoon -- Oral - Nectar Cup Weak lingual manipulation;Right anterior bolus loss;Lingual/palatal residue Oral - Nectar Straw -- Oral - Thin Teaspoon Weak lingual manipulation;Lingual/palatal residue Oral - Thin Cup Lingual/palatal residue;Weak lingual manipulation Oral - Thin Straw Weak lingual manipulation;Lingual/palatal residue Oral - Puree Weak lingual manipulation Oral - Mech Soft -- Oral - Regular Weak lingual manipulation;Impaired mastication;Piecemeal swallowing;Lingual/palatal residue;Decreased bolus cohesion Oral - Multi-Consistency -- Oral - Pill Impaired mastication;Weak lingual manipulation;Piecemeal swallowing;Lingual/palatal residue;Decreased bolus cohesion  Oral Phase - Comment --  CHL IP PHARYNGEAL PHASE 04/09/2015 Pharyngeal Phase Impaired Pharyngeal- Pudding Teaspoon -- Pharyngeal -- Pharyngeal- Pudding Cup -- Pharyngeal -- Pharyngeal- Honey Teaspoon -- Pharyngeal -- Pharyngeal- Honey Cup -- Pharyngeal -- Pharyngeal- Nectar Teaspoon -- Pharyngeal -- Pharyngeal- Nectar Cup Delayed swallow initiation-pyriform sinuses;Penetration/Aspiration during swallow;Pharyngeal residue - valleculae;Pharyngeal residue - pyriform Pharyngeal Material enters airway, CONTACTS  cords and then ejected out Pharyngeal- Nectar Straw -- Pharyngeal -- Pharyngeal- Thin Teaspoon Delayed swallow initiation-pyriform sinuses;Pharyngeal residue - valleculae;Pharyngeal residue - pyriform Pharyngeal -- Pharyngeal- Thin Cup Penetration/Aspiration during swallow;Delayed swallow initiation-pyriform sinuses;Pharyngeal residue - valleculae;Pharyngeal residue - pyriform Pharyngeal Material enters airway, CONTACTS cords and not ejected out Pharyngeal- Thin Straw Penetration/Aspiration before swallow;Pharyngeal residue - valleculae;Pharyngeal residue - pyriform;Delayed swallow initiation-pyriform sinuses;Reduced airway/laryngeal closure Pharyngeal Material enters airway, passes BELOW cords without attempt by patient to eject out (silent aspiration) Pharyngeal- Puree Delayed swallow initiation-vallecula;Pharyngeal residue - pyriform;Pharyngeal residue - valleculae Pharyngeal -- Pharyngeal- Mechanical Soft -- Pharyngeal -- Pharyngeal- Regular Penetration/Aspiration during swallow;Delayed swallow initiation-vallecula;Pharyngeal residue - valleculae Pharyngeal Material enters airway, CONTACTS cords and then ejected out Pharyngeal- Multi-consistency -- Pharyngeal -- Pharyngeal- Pill Delayed swallow initiation-vallecula;Pharyngeal residue - valleculae Pharyngeal -- Pharyngeal Comment --  No flowsheet data found. No flowsheet data found. Marcene Duos MA, CCC-SLP Acute Care Speech Language Pathologist  Kennieth Rad 04/09/2015, 2:52 PM               Assessment/Plan: Diagnosis: Debilitation secondary to encephalopathy Labs and images independently reviewed.  Records reviewed and summated above.  1. Does the need for close, 24 hr/day medical supervision in concert with the patient's rehab needs make it unreasonable for this patient to be served in a less intensive setting? Yes 2. Co-Morbidities requiring supervision/potential complications: Parkinson's disease (cont meds), dysphagia (cont PEG tube),  aspiration pneumonia (Cont abx), hypernatremia (cont to monitor, consider restriction if necessary), anemia (transfuse if necessary to ensure appropriate perfusion for increased activity tolerance), hyperglycemia (cont to monitor and treat as necessary) 3. Due to bladder management, bowel management, safety, skin/wound care, disease management, medication administration and patient education, does the patient require 24 hr/day rehab nursing? Yes 4. Does the patient require coordinated care of a physician, rehab nurse, PT (1-2 hrs/day, 5 days/week), OT (1-2 hrs/day, 5 days/week) and SLP (1-2 hrs/day, 5 days/week) to address physical and functional deficits in the context of the above medical diagnosis(es)? Yes Addressing deficits in the following areas: balance, endurance, locomotion, strength, transferring, bowel/bladder control, bathing, dressing, feeding, grooming, toileting, cognition, speech, language, swallowing and psychosocial support 5. Can the patient actively participate in an intensive therapy program of at least 3 hrs of therapy per day at least 5 days per week? Yes 6. The potential for patient to make measurable gains while on inpatient rehab is excellent 7. Anticipated functional outcomes upon discharge from inpatient rehab are supervision and min assist  with PT, supervision and min assist with OT, supervision and min assist with SLP. 8. Estimated rehab length of stay to reach the above functional goals is: 14-17 days.  9. Does the patient have adequate social supports and living environment to accommodate these discharge functional goals? Yes 10. Anticipated D/C setting: Home 11. Anticipated post D/C treatments: HH therapy and Home excercise program 12. Overall Rehab/Functional Prognosis: good  RECOMMENDATIONS: This patient's condition is appropriate for continued rehabilitative care in the following setting: CIR Patient has agreed to participate in recommended program.  Potentially Note that insurance prior authorization may be required for reimbursement for recommended care.  Comment: Rehab Admissions Coordinator to follow up.  Maryla MorrowAnkit Patel, MD 04/11/2015

## 2015-04-11 NOTE — Progress Notes (Signed)
PATIENT DETAILS Name: Kyle Morrow Age: 75 y.o. Sex: male Date of Birth: 11/18/1940 Admit Date: 04/03/2015 Admitting Physician Lupita Leashouglas B McQuaid, MD QIH:[id number],DGLOVFIPCP:AGUIAR,RAFAELA M., MD  Subjective: Awake and alert-but very weak/deconditioned.  Son/Sister at bedside  Assessment/Plan: Active Problems: Acute hypoxemic respiratory failure: Intubated on admission given inability to protect airway-subsequently with aspiration pneumonia. Extubated on 1/12, doing relatively well on 3 L of oxygen via nasal cannula. Appears very weak and deconditioned.  Hypothermia: Secondary to cold exposure-resolved.  Aspiration pneumonia: Remains febrile-no leukocytosis-Completed course of Unasyn.Blood culture 1/9 negative.  Dysphagia: Speech therapy following, underwent modified barium swallow on 1/14-on dysphagia 1 diet. Spoke with patient's son at bedside on 1/5-explained risk of aspiration-continue with dysphagia 1 diet for now.Suspect could advance diet if deconditioning improves  Mild hypernatremia: Gentle hydration, follow electrolytes  Acute encephalopathy: Secondary to hypothermia/pneumonia-Parkinson's/dementia. Mental status slowly improving-alert-and suspect very close to usual baseline.   Thrombocytopenia: Resolved.  Hypertension:Continue labetalol  Parkinson's disease: Continue Sinemet.   Dementia: Not sure if related to Parkinson's disease, resume Aricept/Lexapro  Deconditioning: Appears very deconditioned-likely secondary to acute illness-await CIR evaluation  Disposition: Remain inpatient-CIR vs SNF on discharge  Antimicrobial agents  See below  Anti-infectives    Start     Dose/Rate Route Frequency Ordered Stop   04/06/15 1200  Ampicillin-Sulbactam (UNASYN) 3 g in sodium chloride 0.9 % 100 mL IVPB     3 g 100 mL/hr over 60 Minutes Intravenous Every 6 hours 04/06/15 1025 04/10/15 1829   04/04/15 1800  vancomycin (VANCOCIN) IVPB 1000 mg/200 mL premix  Status:   Discontinued     1,000 mg 200 mL/hr over 60 Minutes Intravenous Every 12 hours 04/04/15 0346 04/06/15 1025   04/04/15 0400  vancomycin (VANCOCIN) 1,500 mg in sodium chloride 0.9 % 500 mL IVPB     1,500 mg 250 mL/hr over 120 Minutes Intravenous  Once 04/04/15 0346 04/04/15 0611   04/04/15 0400  piperacillin-tazobactam (ZOSYN) IVPB 3.375 g  Status:  Discontinued     3.375 g 12.5 mL/hr over 240 Minutes Intravenous 3 times per day 04/04/15 0346 04/06/15 1025      DVT Prophylaxis: Prophylactic Heparin   Code Status: Partial code   Family Communication Son at bedside  Procedures: ETT 1/08>>> Left rad 1/8>>>1/10  CONSULTS:  pulmonary/intensive care  Time spent 20 minutes-Greater than 50% of this time was spent in counseling, explanation of diagnosis, planning of further management, and coordination of care.  MEDICATIONS: Scheduled Meds: . antiseptic oral rinse  7 mL Mouth Rinse q12n4p  . aspirin  325 mg Oral Daily  . bisacodyl  10 mg Rectal Once  . carbidopa-levodopa  2 tablet Oral Q24H  . Carbidopa-Levodopa   Enteral Q1200  . chlorhexidine  15 mL Mouth Rinse BID  . donepezil  5 mg Oral QHS  . escitalopram  10 mg Oral Daily  . heparin  5,000 Units Subcutaneous 3 times per day  . labetalol  100 mg Oral BID  . polyethylene glycol  17 g Oral Daily   Continuous Infusions:   PRN Meds:.acetaminophen (TYLENOL) oral liquid 160 mg/5 mL, hydrALAZINE, RESOURCE THICKENUP CLEAR    PHYSICAL EXAM: Vital signs in last 24 hours: Filed Vitals:   04/10/15 1032 04/10/15 1331 04/10/15 2115 04/11/15 0508  BP: 132/61 103/57 143/70 141/98  Pulse: 79 73 85 77  Temp:  99.8 F (37.7 C) 98.3 F (36.8 C) 98.6 F (37 C)  TempSrc:  Oral Oral  Resp:   18 18  Height:      Weight:    87 kg (191 lb 12.8 oz)  SpO2:  94% 94% 95%    Weight change: -0.3 kg (-10.6 oz) Filed Weights   04/08/15 1541 04/10/15 0451 04/11/15 0508  Weight: 86 kg (189 lb 9.5 oz) 87.3 kg (192 lb 7.4 oz) 87 kg (191  lb 12.8 oz)   Body mass index is 29.17 kg/(m^2).   Gen Exam: Awake alert -very weak Neck: Supple Chest: B/L Clear anteriorly CVS: S1 S2 Regular, no murmurs.  Abdomen: soft, BS +, non tender, non distended.  Extremities: no edema, lower extremities warm to touch. Neurologic: Non Focal-but with gen weakness  Skin: No Rash.   Wounds: N/A.    Intake/Output from previous day:  Intake/Output Summary (Last 24 hours) at 04/11/15 1128 Last data filed at 04/11/15 0952  Gross per 24 hour  Intake    420 ml  Output   1700 ml  Net  -1280 ml     LAB RESULTS: CBC  Recent Labs Lab 04/05/15 0515 04/06/15 0231 04/09/15 0845  WBC 10.4 8.8 7.3  HGB 12.2* 11.5* 12.4*  HCT 36.7* 34.3* 39.2  PLT 119* 129* 168  MCV 83.8 84.1 86.9  MCH 27.9 28.2 27.5  MCHC 33.2 33.5 31.6  RDW 14.1 13.9 14.0  LYMPHSABS 1.4 1.8  --   MONOABS 0.5 0.6  --   EOSABS 0.0 0.1  --   BASOSABS 0.0 0.0  --     Chemistries   Recent Labs Lab 04/06/15 0231 04/07/15 0345 04/08/15 0420 04/09/15 0845 04/10/15 0810 04/11/15 0607  NA 144 146* 145 146* 146* 146*  K 3.3* 3.5 3.7 3.9 3.7 3.6  CL 111 110 107 108 111 115*  CO2 27 29 27 27 27 25   GLUCOSE 124* 141* 120* 138* 147* 142*  BUN 14 9 12 19 18 19   CREATININE 0.82 0.73 0.82 0.80 0.77 0.74  CALCIUM 8.2* 8.8* 9.1 9.1 8.9 8.8*  MG 2.0  --   --   --   --   --     CBG:  Recent Labs Lab 04/10/15 1748 04/10/15 2009 04/11/15 0005 04/11/15 0506 04/11/15 0807  GLUCAP 96 161* 113* 132* 138*    GFR Estimated Creatinine Clearance: 88.2 mL/min (by C-G formula based on Cr of 0.74).  Coagulation profile  Recent Labs Lab 04/05/15 1100  INR 1.30    Cardiac Enzymes No results for input(s): CKMB, TROPONINI, MYOGLOBIN in the last 168 hours.  Invalid input(s): CK  Invalid input(s): POCBNP No results for input(s): DDIMER in the last 72 hours. No results for input(s): HGBA1C in the last 72 hours. No results for input(s): CHOL, HDL, LDLCALC, TRIG,  CHOLHDL, LDLDIRECT in the last 72 hours. No results for input(s): TSH, T4TOTAL, T3FREE, THYROIDAB in the last 72 hours.  Invalid input(s): FREET3 No results for input(s): VITAMINB12, FOLATE, FERRITIN, TIBC, IRON, RETICCTPCT in the last 72 hours. No results for input(s): LIPASE, AMYLASE in the last 72 hours.  Urine Studies No results for input(s): UHGB, CRYS in the last 72 hours.  Invalid input(s): UACOL, UAPR, USPG, UPH, UTP, UGL, UKET, UBIL, UNIT, UROB, ULEU, UEPI, UWBC, URBC, UBAC, CAST, UCOM, BILUA  MICROBIOLOGY: Recent Results (from the past 240 hour(s))  MRSA PCR Screening     Status: None   Collection Time: 04/03/15  5:49 PM  Result Value Ref Range Status   MRSA by PCR NEGATIVE NEGATIVE Final  Comment:        The GeneXpert MRSA Assay (FDA approved for NASAL specimens only), is one component of a comprehensive MRSA colonization surveillance program. It is not intended to diagnose MRSA infection nor to guide or monitor treatment for MRSA infections.   Culture, blood (Routine X 2) w Reflex to ID Panel     Status: None   Collection Time: 04/04/15  4:30 AM  Result Value Ref Range Status   Specimen Description BLOOD LEFT ARM  Final   Special Requests BOTTLES DRAWN AEROBIC AND ANAEROBIC 5CC  Final   Culture NO GROWTH 5 DAYS  Final   Report Status 04/09/2015 FINAL  Final  Culture, blood (Routine X 2) w Reflex to ID Panel     Status: None   Collection Time: 04/04/15  4:35 AM  Result Value Ref Range Status   Specimen Description BLOOD LEFT HAND  Final   Special Requests BOTTLES DRAWN AEROBIC AND ANAEROBIC 5CC  Final   Culture NO GROWTH 5 DAYS  Final   Report Status 04/09/2015 FINAL  Final  Culture, respiratory (NON-Expectorated)     Status: None   Collection Time: 04/04/15  6:34 AM  Result Value Ref Range Status   Specimen Description TRACHEAL ASPIRATE  Final   Special Requests NONE  Final   Gram Stain   Final    ABUNDANT WBC PRESENT,BOTH PMN AND MONONUCLEAR RARE  SQUAMOUS EPITHELIAL CELLS PRESENT MODERATE GRAM POSITIVE COCCI IN PAIRS Performed at [facility]    Culture   Final    NORMAL OROPHARYNGEAL FLORA Performed at [facility]    Report Status 04/06/2015 FINAL  Final    RADIOLOGY STUDIES/RESULTS: Dg Elbow Complete Right  04/04/2015  CLINICAL DATA:  Intubated sedated patient with history of Parkinson's disease EXAM: RIGHT ELBOW - COMPLETE 3+ VIEW COMPARISON:  None in PACs FINDINGS: AP and lateral portable views of the elbow reveal no acute fracture or dislocation. There is mild soft tissue swelling over the olecranon and there is a tiny olecranon spur. A tiny bony irregularity along the posterior superior aspect of the olecranon is present. No joint effusion is evident. The radial head is intact. The distal humerus is unremarkable. IMPRESSION: Mild soft tissue swelling posteriorly may reflect olecranon bursitis with a small olecranon spur. A bony density along the extreme posterior superior aspect of the olecranon likely reflects an osteophyte though a tiny avulsion is not absolutely excluded. Correlation with any history of trauma is needed. The clinical history mentions a pressure ulcer although the side is not specified. Electronically Signed   By: David  [place] M.D.   On: 04/04/2015 07:46   Ct Head Wo Contrast  04/03/2015  CLINICAL DATA:  75 year old male found down outside EXAM: CT HEAD WITHOUT CONTRAST CT CERVICAL SPINE WITHOUT CONTRAST TECHNIQUE: Multidetector CT imaging of the head and cervical spine was performed following the standard protocol without intravenous contrast. Multiplanar CT image reconstructions of the cervical spine were also generated. COMPARISON:  None. FINDINGS: CT HEAD FINDINGS Negative for acute intracranial hemorrhage, acute infarction, mass, mass effect, hydrocephalus or midline shift. Gray-white differentiation is preserved throughout. Mild cerebral cortical atrophy. Small well-defined low-attenuation focus  in the right putaminal most consistent with a remote lacunar infarct. No focal scalp contusion or hematoma. Globes and orbits are symmetric and intact bilaterally. No focal calvarial fracture. Partial opacification of several scattered right-sided ethmoid air cells. Atherosclerotic calcifications in both cavernous carotid arteries. CT CERVICAL SPINE FINDINGS Moderate image degradation secondary to patient motion.  No acute fracture, malalignment or prevertebral soft tissue swelling. Multilevel cervical spondylosis. Mild (2 mm) degenerative anterolisthesis of C5 on C6. Right greater than left facet arthropathy. The patient is intubated and a nasogastric tube are present. Unremarkable CT appearance of the thyroid gland. No acute soft tissue abnormality. The lung apices are unremarkable. IMPRESSION: CT HEAD 1. No acute intracranial abnormality. 2. Cerebral cortical atrophy. 3. Remote right basal ganglia lacunar infarct. 4. Atherosclerotic vascular calcifications in the intracranial carotid arteries. CT CSPINE 1. No evidence of acute fracture or malalignment. 2. Multilevel cervical spondylosis including mild degenerative anterolisthesis of C5 on C6. 3. Right greater than left facet arthropathy. Electronically Signed   By: Malachy Moan M.D.   On: 04/03/2015 13:52   Ct Cervical Spine Wo Contrast  04/03/2015  CLINICAL DATA:  75 year old male found down outside EXAM: CT HEAD WITHOUT CONTRAST CT CERVICAL SPINE WITHOUT CONTRAST TECHNIQUE: Multidetector CT imaging of the head and cervical spine was performed following the standard protocol without intravenous contrast. Multiplanar CT image reconstructions of the cervical spine were also generated. COMPARISON:  None. FINDINGS: CT HEAD FINDINGS Negative for acute intracranial hemorrhage, acute infarction, mass, mass effect, hydrocephalus or midline shift. Gray-white differentiation is preserved throughout. Mild cerebral cortical atrophy. Small well-defined low-attenuation  focus in the right putaminal most consistent with a remote lacunar infarct. No focal scalp contusion or hematoma. Globes and orbits are symmetric and intact bilaterally. No focal calvarial fracture. Partial opacification of several scattered right-sided ethmoid air cells. Atherosclerotic calcifications in both cavernous carotid arteries. CT CERVICAL SPINE FINDINGS Moderate image degradation secondary to patient motion. No acute fracture, malalignment or prevertebral soft tissue swelling. Multilevel cervical spondylosis. Mild (2 mm) degenerative anterolisthesis of C5 on C6. Right greater than left facet arthropathy. The patient is intubated and a nasogastric tube are present. Unremarkable CT appearance of the thyroid gland. No acute soft tissue abnormality. The lung apices are unremarkable. IMPRESSION: CT HEAD 1. No acute intracranial abnormality. 2. Cerebral cortical atrophy. 3. Remote right basal ganglia lacunar infarct. 4. Atherosclerotic vascular calcifications in the intracranial carotid arteries. CT CSPINE 1. No evidence of acute fracture or malalignment. 2. Multilevel cervical spondylosis including mild degenerative anterolisthesis of C5 on C6. 3. Right greater than left facet arthropathy. Electronically Signed   By: Malachy Moan M.D.   On: 04/03/2015 13:52   Dg Pelvis Portable  04/03/2015  CLINICAL DATA:  Seizure EXAM: PORTABLE PELVIS 1-2 VIEWS COMPARISON:  None FINDINGS: There is a left hip arthroplasty device. Moderate degenerative changes are noted involving the right hip. Rectal temperature probe noted. IMPRESSION: 1. No acute findings. Electronically Signed   By: Signa Kell M.D.   On: 04/03/2015 13:03   Dg Chest Port 1 View  04/06/2015  CLINICAL DATA:  Acute respiratory failure, Parkinson's disease, seizure activity, remote history of smoking. EXAM: PORTABLE CHEST 1 VIEW COMPARISON:  Portable chest x-ray of April 05, 2015 FINDINGS: The lungs are reasonably well inflated. There is  bibasilar atelectasis greater on the right. No alveolar infiltrates are observed. The cardiac silhouette is mildly enlarged but stable. The central pulmonary vascularity is less prominent today. The endotracheal tube tip lies 4.8 cm above the carina. The esophagogastric tube tip projects below the inferior margin of the image. The left internal jugular venous catheter tip projects over the midportion of the SVC. The bony thorax is unremarkable where visualized. IMPRESSION: Interval improvement in bibasilar atelectasis or pneumonia. Mild stable cardiomegaly and improved central pulmonary vascular prominence. The support tubes are in reasonable  position. Electronically Signed   By: David  [place] M.D.   On: 04/06/2015 06:55   Dg Chest Port 1 View  04/05/2015  CLINICAL DATA:  Acute respiratory failure. EXAM: PORTABLE CHEST 1 VIEW COMPARISON:  04/04/2015. FINDINGS: Endotracheal tube, left IJ line, NG tube in stable position . Cardiomegaly with pulmonary venous congestion . Bilateral pulmonary infiltrates. Findings consistent congestive heart failure. Small bilateral pleural effusions. No pneumothorax . IMPRESSION: 1. Lines and tubes in stable position. 2. Cardiomegaly with pulmonary venous congestion and bilateral pulmonary infiltrates consistent congestive heart failure. Bilateral pneumonia cannot be excluded. Electronically Signed   By: Maisie Fus  Register   On: 04/05/2015 07:18   Dg Chest Port 1 View  04/04/2015  CLINICAL DATA:  Hypoxia. Recent seizure. Central catheter placement EXAM: PORTABLE CHEST 1 VIEW COMPARISON:  Study obtained earlier in the day FINDINGS: Endotracheal tube tip is 6.6 cm above the carina. Central catheter tip is in the superior vena cava. Nasogastric tube tip and side port are in the stomach. No pneumothorax. There are areas of patchy consolidation in the right medial right base and left lower lobe regions. No new opacity. Heart is upper normal in size with pulmonary vascularity within normal  limits. No adenopathy. No bone lesions. IMPRESSION: Stable patchy areas of consolidation bilaterally. No new opacity. Tube and catheter positions as described without pneumothorax. Electronically Signed   By: Bretta Bang III M.D.   On: 04/04/2015 14:59   Dg Chest Port 1 View  04/04/2015  CLINICAL DATA:  Respiratory failure. EXAM: PORTABLE CHEST 1 VIEW COMPARISON:  04/03/2015 FINDINGS: New endotracheal tube and NG tube in stable position. Mediastinum hilar structures normal. Progressive bibasilar atelectasis and/or infiltrates. No pleural effusion or pneumothorax. IMPRESSION: 1. Endotracheal tube and NG tube in good anatomic position. 2. Progressive bibasilar atelectasis and/or infiltrates. Electronically Signed   By: Maisie Fus  Register   On: 04/04/2015 07:43   Dg Chest Portable 1 View  04/03/2015  CLINICAL DATA:  Seizure EXAM: PORTABLE CHEST 1 VIEW COMPARISON:  None FINDINGS: The ET tube tip is above the carina. There is a nasogastric tube with tip in the stomach. Normal heart size. No pleural effusion or edema. No airspace consolidation. IMPRESSION: 1. No acute cardiopulmonary abnormalities. 2. Satisfactory position of the support apparatus. Electronically Signed   By: Signa Kell M.D.   On: 04/03/2015 13:00   Dg Swallowing Func-speech Pathology  04/09/2015  Objective Swallowing Evaluation:   Patient Details Name: Graciano Batson MRN: [id number] Date of Birth: 01/01/1941 Today's Date: 04/09/2015 Time: SLP Start Time (ACUTE ONLY): 1400-SLP Stop Time (ACUTE ONLY): 1417 SLP Time Calculation (min) (ACUTE ONLY): 17 min Past Medical History: @PMH @ Past Surgical History: Past Surgical History Procedure Laterality Date . Hip surgery     Left HPI: 74 y.o.male with PMH of Parkinson's/dementia dependent on family for ADL's. Last seen at 2 AM and then found out in the cold by family around 8AM. Brought to ER with core temp 23, seizing. Intubated on 04/04/15, extubated 04/07/15.  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 Delay in swallow initiation observed with thin and nectar thick liquids to the level of the pyriform sinuses. Silent aspiration evidenced with thin liquids via straw sip. Pt with inconsistent deep penetration with thin liquids via cup sip, which increased when pt consumed larger bolus sizes. No aspiration with  nectar thick liquids, however intermittent penetration was observed yet cleared with pts reflexive swallow. Pt with sensed deep penetration of mechanical soft consistencies that also cleared with cueing for hard cough and additonal swallow. Pharyngeal residuals noted to increase with mechanical soft textures, however liquid wash down was effective in decreasing residual amount. Given hx of aspiration PNA, reduced mentation, and decreased respiratory status recommend conservative diet implementation of dysphagia 1 (puree) and nectar thick liquids. Attempted whole barium tablet in puree during MBS, which pt chewed. Recommend medicines crushed in puree. Full supervision and feeding assistance warranted with all PO. ST to follow up and clinically upgrade diet as tolerated.   Impact on safety and function Mild aspiration risk;Moderate aspiration risk   CHL IP TREATMENT RECOMMENDATION 04/09/2015 Treatment Recommendations Therapy as outlined in treatment plan below   Prognosis 04/09/2015 Prognosis for Safe Diet Advancement Fair Barriers to Reach Goals Time post onset Barriers/Prognosis Comment -- CHL IP DIET RECOMMENDATION 04/09/2015 SLP Diet Recommendations Dysphagia 1 (Puree) solids;Nectar thick liquid Liquid Administration via Cup Medication Administration Crushed with puree Compensations Minimize environmental distractions;Slow rate;Small sips/bites;Follow solids with liquid Postural  Changes Remain semi-upright after after feeds/meals (Comment);Seated upright at 90 degrees   CHL IP OTHER RECOMMENDATIONS 04/09/2015 Recommended Consults -- Oral Care Recommendations Oral care BID Other Recommendations Order thickener from pharmacy   CHL IP FOLLOW UP RECOMMENDATIONS 04/09/2015 Follow up Recommendations 24 hour supervision/assistance   CHL IP FREQUENCY AND DURATION 04/09/2015 Speech Therapy Frequency (ACUTE ONLY) min 2x/week Treatment Duration 2 weeks      CHL IP ORAL PHASE 04/09/2015 Oral Phase Impaired Oral - Pudding Teaspoon -- Oral - Pudding Cup -- Oral - Honey Teaspoon -- Oral - Honey Cup -- Oral - Nectar Teaspoon -- Oral - Nectar Cup Weak lingual manipulation;Right anterior bolus loss;Lingual/palatal residue Oral - Nectar Straw -- Oral - Thin Teaspoon Weak lingual manipulation;Lingual/palatal residue Oral - Thin Cup Lingual/palatal residue;Weak lingual manipulation Oral - Thin Straw Weak lingual manipulation;Lingual/palatal residue Oral - Puree Weak lingual manipulation Oral - Mech Soft -- Oral - Regular Weak lingual manipulation;Impaired mastication;Piecemeal swallowing;Lingual/palatal residue;Decreased bolus cohesion Oral - Multi-Consistency -- Oral - Pill Impaired mastication;Weak lingual manipulation;Piecemeal swallowing;Lingual/palatal residue;Decreased bolus cohesion Oral Phase - Comment --  CHL IP PHARYNGEAL PHASE 04/09/2015 Pharyngeal Phase Impaired Pharyngeal- Pudding Teaspoon -- Pharyngeal -- Pharyngeal- Pudding Cup -- Pharyngeal -- Pharyngeal- Honey Teaspoon -- Pharyngeal -- Pharyngeal- Honey Cup -- Pharyngeal -- Pharyngeal- Nectar Teaspoon -- Pharyngeal -- Pharyngeal- Nectar Cup Delayed swallow initiation-pyriform sinuses;Penetration/Aspiration during swallow;Pharyngeal residue - valleculae;Pharyngeal residue - pyriform Pharyngeal Material enters airway, CONTACTS cords and then ejected out Pharyngeal- Nectar Straw -- Pharyngeal -- Pharyngeal- Thin Teaspoon Delayed swallow  initiation-pyriform sinuses;Pharyngeal residue - valleculae;Pharyngeal residue - pyriform Pharyngeal -- Pharyngeal- Thin Cup Penetration/Aspiration during swallow;Delayed swallow initiation-pyriform sinuses;Pharyngeal residue - valleculae;Pharyngeal residue - pyriform Pharyngeal Material enters airway, CONTACTS cords and not ejected out Pharyngeal- Thin Straw Penetration/Aspiration before swallow;Pharyngeal residue - valleculae;Pharyngeal residue - pyriform;Delayed swallow initiation-pyriform sinuses;Reduced airway/laryngeal closure Pharyngeal Material enters airway, passes BELOW cords without attempt by patient to eject out (silent aspiration) Pharyngeal- Puree Delayed swallow initiation-vallecula;Pharyngeal residue - pyriform;Pharyngeal residue - valleculae Pharyngeal -- Pharyngeal- Mechanical Soft -- Pharyngeal -- Pharyngeal- Regular Penetration/Aspiration during swallow;Delayed swallow initiation-vallecula;Pharyngeal residue - valleculae Pharyngeal Material enters airway, CONTACTS cords and then ejected out Pharyngeal- Multi-consistency -- Pharyngeal -- Pharyngeal- Pill Delayed swallow initiation-vallecula;Pharyngeal residue - valleculae Pharyngeal -- Pharyngeal Comment --  No flowsheet data found. No flowsheet data found. Marcene Duos MA, CCC-SLP Acute Care Speech Language Pathologist  Simpson,  Chelsea E 04/09/2015, 2:52 PM               Jeoffrey Massed, MD  [facility] Pager:[phone]  If 7PM-7AM, please contact night-coverage [email or web address] Password [email or web address] 04/11/2015, 11:28 AM   LOS: 8 days

## 2015-04-11 NOTE — [place] FL2 (Signed)
MEDICAID FL2 LEVEL OF CARE SCREENING TOOL     IDENTIFICATION  Patient Name: Kyle Morrow Birthdate: 04-30-1940 Sex: male Admission Date (Current Location): 04/03/2015  [facility] and [place] Number:  Producer, television/film/video and Address:  [facility], [address], [place], [place] [address]      Provider Number: [id number]  Attending Physician Name and Address:  Maretta Bees, MD  Relative Name and Phone Number:  Lanora Manis [phone]    Current Level of Care: Hospital Recommended Level of Care: Skilled Nursing Facility Prior Approval Number:    Date Approved/Denied:   PASRR Number: [id number]  Discharge Plan: SNF    Current Diagnoses: Patient Active Problem List   Diagnosis Date Noted  . Acute blood loss anemia   . Acute encephalopathy   . Dysphagia   . Hyperglycemia   . Hypernatremia   . Acute respiratory failure (HCC)   . Right elbow pain   . Hypothermia 04/03/2015  . Pressure ulcer 04/03/2015  . Seizure (HCC)   . Acute respiratory failure with hypercapnia (HCC)   . Parkinson disease (HCC)     Orientation RESPIRATION BLADDER Height & Weight    Self, Time, Place  O2 (1L) Incontinent 5\' 8"  (172.7 cm) 191 lbs.  BEHAVIORAL SYMPTOMS/MOOD NEUROLOGICAL BOWEL NUTRITION STATUS   (N/A)   Incontinent (Enterostomy RUQ)  (Please see DC summary)  AMBULATORY STATUS COMMUNICATION OF NEEDS Skin   Extensive Assist Verbally PU Stage and Appropriate Care (Unstageable on hand and elbow)                       Personal Care Assistance Level of Assistance  Bathing, Feeding, Dressing Bathing Assistance: Maximum assistance Feeding assistance: Limited assistance Dressing Assistance: Maximum assistance     Functional Limitations Info             SPECIAL CARE FACTORS FREQUENCY  PT (By licensed PT)     PT Frequency: 5x/week              Contractures      Additional Factors Info  Code Status, Allergies Code Status  Info: Partial Allergies Info: NKA           Current Medications (04/11/2015):  This is the current hospital active medication list Current Facility-Administered Medications  Medication Dose Route Frequency Provider Last Rate Last Dose  . acetaminophen (TYLENOL) solution 650 mg  650 mg Per Tube Q6H PRN Karl Ito, MD   650 mg at 04/06/15 2133  . antiseptic oral rinse (CPC / CETYLPYRIDINIUM CHLORIDE 0.05%) solution 7 mL  7 mL Mouth Rinse q12n4p Lupita Leash, MD   7 mL at 04/11/15 1625  . aspirin tablet 325 mg  325 mg Oral Daily Lupita Leash, MD   325 mg at 04/11/15 1105  . bisacodyl (DULCOLAX) suppository 10 mg  10 mg Rectal Once Lupita Leash, MD   10 mg at 04/07/15 1222  . carbidopa-levodopa (SINEMET IR) 25-100 MG per tablet immediate release 2 tablet  2 tablet Oral Q24H Maretta Bees, MD   2 tablet at 04/11/15 0037  . Carbidopa-Levodopa 4.63-20 MG/ML SUSP   Enteral Q1200 Nelda Bucks, MD      . chlorhexidine (PERIDEX) 0.12 % solution 15 mL  15 mL Mouth Rinse BID Lupita Leash, MD   15 mL at 04/10/15 2000  . donepezil (ARICEPT) tablet 5 mg  5 mg Oral QHS Shanker Levora Dredge, MD  5 mg at 04/10/15 2157  . escitalopram (LEXAPRO) tablet 10 mg  10 mg Oral Daily Maretta BeesShanker M Ghimire, MD   10 mg at 04/11/15 1105  . heparin injection 5,000 Units  5,000 Units Subcutaneous 3 times per day Courtney ParisEden W Jones, MD   5,000 Units at 04/11/15 1624  . hydrALAZINE (APRESOLINE) injection 10 mg  10 mg Intravenous Q4H PRN Nelda Bucksaniel J Feinstein, MD   10 mg at 04/08/15 0204  . labetalol (NORMODYNE) tablet 100 mg  100 mg Oral BID Lupita Leashouglas B McQuaid, MD   100 mg at 04/11/15 1105  . polyethylene glycol (MIRALAX / GLYCOLAX) packet 17 g  17 g Oral Daily Nelda Bucksaniel J Feinstein, MD   17 g at 04/10/15 1039  . RESOURCE THICKENUP CLEAR   Oral PRN Nelda Bucksaniel J Feinstein, MD         Discharge Medications: Please see discharge summary for a list of discharge medications.  Relevant Imaging Results:  Relevant  Lab Results:   Additional Information SSN: [id number]  Mearl LatinNadia S Marvelyn Bouchillon, LCSWA

## 2015-04-11 NOTE — Care Management Important Message (Signed)
Important Message  Patient Details  Name: Kyle Morrow MRN: [id number] Date of Birth: 10/18/1940   Medicare Important Message Given:  Yes    Bernadette HoitShoffner, Bart Ashford Coleman 04/11/2015, 12:46 PM

## 2015-04-11 NOTE — Progress Notes (Signed)
Speech Language Pathology Treatment: Dysphagia  Patient Details Name: Kyle Morrow MRN: [id number] DOB: 01/28/1941 Today's Date: 04/11/2015 Time: [id number] SLP Time Calculation (min) (ACUTE ONLY): 20 min  Assessment / Plan / Recommendation Clinical Impression  Pt demonstrates stable function as documented on MBS on Saturday. Pt still quite deconditioned, requires verbal cues and assist with meals as he keeps his eyes closed during meals, though he is appropriately responsive. Provided verbal cues to increase effort with throat clearing. Educated family to strategies and precautions; son finished meal following recommendations. Pt is not ready for diet upgrade given ongoing deconditioning, continue dys 1/nectar.    HPI HPI: 75 y.o.male with PMH of Parkinson's/dementia dependent on family for ADL's. Last seen at 2 AM and then found out in the cold by family around 8AM. Brought to ER with core temp 23, seizing. Intubated on 04/04/15, extubated 04/07/15.       SLP Plan        Recommendations  Diet recommendations: Dysphagia 1 (puree);Nectar-thick liquid Liquids provided via: Cup Medication Administration: Crushed with puree Supervision: Full supervision/cueing for compensatory strategies;Staff to assist with self feeding Compensations: Minimize environmental distractions;Slow rate;Small sips/bites;Follow solids with liquid Postural Changes and/or Swallow Maneuvers: Seated upright 90 degrees;Upright 30-60 min after meal             Oral Care Recommendations: Oral care BID Follow up Recommendations: 24 hour supervision/assistance     GO                Rishika Mccollom, Riley NearingBonnie Caroline 04/11/2015, 1:29 PM

## 2015-04-12 LAB — BASIC METABOLIC PANEL
Anion gap: 7 (ref 5–15)
BUN: 19 mg/dL (ref 6–20)
CALCIUM: 8.9 mg/dL (ref 8.9–10.3)
CHLORIDE: 111 mmol/L (ref 101–111)
CO2: 25 mmol/L (ref 22–32)
CREATININE: 0.74 mg/dL (ref 0.61–1.24)
GFR calc Af Amer: >60 mL/min (ref >60)
GFR calc non Af Amer: >60 mL/min (ref >60)
GLUCOSE: 119 mg/dL — AB (ref 65–99)
Potassium: 3.6 mmol/L (ref 3.5–5.1)
Sodium: 143 mmol/L (ref 135–145)

## 2015-04-12 LAB — GLUCOSE, CAPILLARY
GLUCOSE-CAPILLARY: 140 mg/dL — AB (ref 65–99)
Glucose-Capillary: 110 mg/dL — ABNORMAL HIGH (ref 65–99)
Glucose-Capillary: 112 mg/dL — ABNORMAL HIGH (ref 65–99)
Glucose-Capillary: 119 mg/dL — ABNORMAL HIGH (ref 65–99)
Glucose-Capillary: 122 mg/dL — ABNORMAL HIGH (ref 65–99)
Glucose-Capillary: 129 mg/dL — ABNORMAL HIGH (ref 65–99)

## 2015-04-12 MED ORDER — LABETALOL HCL 100 MG PO TABS: 100.0000 mg | ORAL_TABLET | Freq: Two times a day (BID) | ORAL | Status: AC

## 2015-04-12 NOTE — Progress Notes (Signed)
Physical Therapy Treatment Patient Details Name: Kyle Morrow MRN: [id number] DOB: Oct 19, 1940 Today's Date: 04/14/2015    History of Present Illness Adm 04/03/15 after found down outside with hypothermia and seizures. Pt intubated 1/8-1/12. PMHx-Parkinson's with dementia and carbidopa pump (administered via PEG-like tube), Lt hip surgery     PT Comments    Pt was seen for passive ROM to legs, has very limited active use of LLE and increased stiffness with all quality of movement.  Will anticipate his continued therapy in SNF to increase standing tolerance and for time OOB to improve endurance.  Family very supportive of his therapy needs.  Follow Up Recommendations  SNF     Equipment Recommendations  None recommended by PT    Recommendations for Other Services Rehab consult;OT consult     Precautions / Restrictions Precautions Precautions: Fall Restrictions Weight Bearing Restrictions: No    Mobility  Bed Mobility Overal bed mobility: Needs Assistance;+2 for physical assistance Bed Mobility: Rolling Rolling: Mod assist;+2 for physical assistance;+2 for safety/equipment            Transfers                 General transfer comment: Pt did not want to attempt  Ambulation/Gait             General Gait Details: unable   Stairs            Wheelchair Mobility    Modified Rankin (Stroke Patients Only)       Balance                                    Cognition Arousal/Alertness: Lethargic Behavior During Therapy: Flat affect Overall Cognitive Status: Difficult to assess       Memory: Decreased recall of precautions;Decreased short-term memory              Exercises General Exercises - Lower Extremity Ankle Circles/Pumps: PROM;Both;10 reps Short Arc Quad: PROM;Both;5 reps Heel Slides: PROM;Both;10 reps Hip ABduction/ADduction: PROM;Both;20 reps Straight Leg Raises: PROM;Both;5 reps    General Comments General  comments (skin integrity, edema, etc.): Pt cannot give a history of his loss of independence      Pertinent Vitals/Pain Pain Assessment: No/denies pain    Home Living                      Prior Function            PT Goals (current goals can now be found in the care plan section) Acute Rehab PT Goals Patient Stated Goal: to rest Progress towards PT goals: Progressing toward goals    Frequency  Min 3X/week    PT Plan Current plan remains appropriate    Co-evaluation             End of Session Equipment Utilized During Treatment: Oxygen Activity Tolerance: Patient tolerated treatment well Patient left: in bed;with call bell/phone within reach     Time: 4098-1191 PT Time Calculation (min) (ACUTE ONLY): 20 min  Charges:  $Therapeutic Exercise: 8-22 mins                    G Codes:      Ivar Drape 14-Apr-2015, 4:10 PM   Samul Dada, PT MS Acute Rehab Dept. Number: [id number] and [facility] [phone]

## 2015-04-12 NOTE — Discharge Summary (Addendum)
PATIENT DETAILS Name: Kyle Morrow Age: 75 y.o. Sex: male Date of Birth: [birth year] MRN: [id number]. Admitting Physician: Lupita Leash, MD WUJ:[id number] Judie Petit., MD  Admit Date: 04/03/2015 Discharge date: 04/14/2015  Recommendations for Outpatient Follow-up:  Please recheck CBC and BMET 1 WEEK Ensure Speech therapy follow up at SNF Ensure follow up with Primary Neurologist at [facility]  PRIMARY DISCHARGE DIAGNOSIS:  Active Problems:   Hypothermia   Pressure ulcer   Acute respiratory failure (HCC)   Right elbow pain   Acute blood loss anemia   Acute encephalopathy   Dysphagia   Hyperglycemia   Hypernatremia      PAST MEDICAL HISTORY: Past Medical History  Diagnosis Date  . Parkinson disease (HCC)     DISCHARGE MEDICATIONS: Current Discharge Medication List    START taking these medications   Details  labetalol (NORMODYNE) 100 MG tablet Take 1 tablet (100 mg total) by mouth 2 (two) times daily.      CONTINUE these medications which have NOT CHANGED   Details  aspirin EC 325 MG tablet Take 325 mg by mouth daily as needed (pain).    Carbidopa-Levodopa (DUOPA EN) by Enteral route See admin instructions. Pump administers 4.63 mg/20 mg per ml - connect to portal every morning between 9am & 11am, disconnect 12-13 hours later. Rate is 3.4 mL/hr verified on pump.    carbidopa-levodopa (SINEMET IR) 25-100 MG tablet Take 2 tablets by mouth See admin instructions. Take 2 tablets by mouth when Duopa pump is removed each evening    donepezil (ARICEPT) 5 MG tablet Take 5 mg by mouth at bedtime.    escitalopram (LEXAPRO) 10 MG tablet Take 10 mg by mouth daily.        ALLERGIES:  No Known Allergies  History of present illness:  Kyle Morrow is a 75 yo make with PMH of Parkinson's disease with mild cognitive impairment, presented to ED via EMS after being found outside of his house. Patient was seizing and unresponsive. After evaluation, it was discovered the patient  developed hypothermia and aspiration pneumonia. Patient has been deconditioned but is more awake, alert, and oriented today.  CONSULTATIONS:   pulmonary/intensive care  PERTINENT RADIOLOGIC STUDIES: Dg Elbow Complete Right  04/04/2015  CLINICAL DATA:  Intubated sedated patient with history of Parkinson's disease EXAM: RIGHT ELBOW - COMPLETE 3+ VIEW COMPARISON:  None in PACs FINDINGS: AP and lateral portable views of the elbow reveal no acute fracture or dislocation. There is mild soft tissue swelling over the olecranon and there is a tiny olecranon spur. A tiny bony irregularity along the posterior superior aspect of the olecranon is present. No joint effusion is evident. The radial head is intact. The distal humerus is unremarkable. IMPRESSION: Mild soft tissue swelling posteriorly may reflect olecranon bursitis with a small olecranon spur. A bony density along the extreme posterior superior aspect of the olecranon likely reflects an osteophyte though a tiny avulsion is not absolutely excluded. Correlation with any history of trauma is needed. The clinical history mentions a pressure ulcer although the side is not specified. Electronically Signed   By: David  [place] M.D.   On: 04/04/2015 07:46   Ct Head Wo Contrast  04/03/2015  CLINICAL DATA:  75 year old male found down outside EXAM: CT HEAD WITHOUT CONTRAST CT CERVICAL SPINE WITHOUT CONTRAST TECHNIQUE: Multidetector CT imaging of the head and cervical spine was performed following the standard protocol without intravenous contrast. Multiplanar CT image reconstructions of the cervical spine were also generated. COMPARISON:  None.  FINDINGS: CT HEAD FINDINGS Negative for acute intracranial hemorrhage, acute infarction, mass, mass effect, hydrocephalus or midline shift. Gray-white differentiation is preserved throughout. Mild cerebral cortical atrophy. Small well-defined low-attenuation focus in the right putaminal most consistent with a remote lacunar  infarct. No focal scalp contusion or hematoma. Globes and orbits are symmetric and intact bilaterally. No focal calvarial fracture. Partial opacification of several scattered right-sided ethmoid air cells. Atherosclerotic calcifications in both cavernous carotid arteries. CT CERVICAL SPINE FINDINGS Moderate image degradation secondary to patient motion. No acute fracture, malalignment or prevertebral soft tissue swelling. Multilevel cervical spondylosis. Mild (2 mm) degenerative anterolisthesis of C5 on C6. Right greater than left facet arthropathy. The patient is intubated and a nasogastric tube are present. Unremarkable CT appearance of the thyroid gland. No acute soft tissue abnormality. The lung apices are unremarkable. IMPRESSION: CT HEAD 1. No acute intracranial abnormality. 2. Cerebral cortical atrophy. 3. Remote right basal ganglia lacunar infarct. 4. Atherosclerotic vascular calcifications in the intracranial carotid arteries. CT CSPINE 1. No evidence of acute fracture or malalignment. 2. Multilevel cervical spondylosis including mild degenerative anterolisthesis of C5 on C6. 3. Right greater than left facet arthropathy. Electronically Signed   By: Malachy Moan M.D.   On: 04/03/2015 13:52   Ct Cervical Spine Wo Contrast  04/03/2015  CLINICAL DATA:  75 year old male found down outside EXAM: CT HEAD WITHOUT CONTRAST CT CERVICAL SPINE WITHOUT CONTRAST TECHNIQUE: Multidetector CT imaging of the head and cervical spine was performed following the standard protocol without intravenous contrast. Multiplanar CT image reconstructions of the cervical spine were also generated. COMPARISON:  None. FINDINGS: CT HEAD FINDINGS Negative for acute intracranial hemorrhage, acute infarction, mass, mass effect, hydrocephalus or midline shift. Gray-white differentiation is preserved throughout. Mild cerebral cortical atrophy. Small well-defined low-attenuation focus in the right putaminal most consistent with a remote  lacunar infarct. No focal scalp contusion or hematoma. Globes and orbits are symmetric and intact bilaterally. No focal calvarial fracture. Partial opacification of several scattered right-sided ethmoid air cells. Atherosclerotic calcifications in both cavernous carotid arteries. CT CERVICAL SPINE FINDINGS Moderate image degradation secondary to patient motion. No acute fracture, malalignment or prevertebral soft tissue swelling. Multilevel cervical spondylosis. Mild (2 mm) degenerative anterolisthesis of C5 on C6. Right greater than left facet arthropathy. The patient is intubated and a nasogastric tube are present. Unremarkable CT appearance of the thyroid gland. No acute soft tissue abnormality. The lung apices are unremarkable. IMPRESSION: CT HEAD 1. No acute intracranial abnormality. 2. Cerebral cortical atrophy. 3. Remote right basal ganglia lacunar infarct. 4. Atherosclerotic vascular calcifications in the intracranial carotid arteries. CT CSPINE 1. No evidence of acute fracture or malalignment. 2. Multilevel cervical spondylosis including mild degenerative anterolisthesis of C5 on C6. 3. Right greater than left facet arthropathy. Electronically Signed   By: Malachy Moan M.D.   On: 04/03/2015 13:52   Dg Pelvis Portable  04/03/2015  CLINICAL DATA:  Seizure EXAM: PORTABLE PELVIS 1-2 VIEWS COMPARISON:  None FINDINGS: There is a left hip arthroplasty device. Moderate degenerative changes are noted involving the right hip. Rectal temperature probe noted. IMPRESSION: 1. No acute findings. Electronically Signed   By: Signa Kell M.D.   On: 04/03/2015 13:03   Dg Chest Port 1 View  04/06/2015  CLINICAL DATA:  Acute respiratory failure, Parkinson's disease, seizure activity, remote history of smoking. EXAM: PORTABLE CHEST 1 VIEW COMPARISON:  Portable chest x-ray of April 05, 2015 FINDINGS: The lungs are reasonably well inflated. There is bibasilar atelectasis greater on the right.  No alveolar infiltrates  are observed. The cardiac silhouette is mildly enlarged but stable. The central pulmonary vascularity is less prominent today. The endotracheal tube tip lies 4.8 cm above the carina. The esophagogastric tube tip projects below the inferior margin of the image. The left internal jugular venous catheter tip projects over the midportion of the SVC. The bony thorax is unremarkable where visualized. IMPRESSION: Interval improvement in bibasilar atelectasis or pneumonia. Mild stable cardiomegaly and improved central pulmonary vascular prominence. The support tubes are in reasonable position. Electronically Signed   By: David  [place] M.D.   On: 04/06/2015 06:55   Dg Chest Port 1 View  04/05/2015  CLINICAL DATA:  Acute respiratory failure. EXAM: PORTABLE CHEST 1 VIEW COMPARISON:  04/04/2015. FINDINGS: Endotracheal tube, left IJ line, NG tube in stable position . Cardiomegaly with pulmonary venous congestion . Bilateral pulmonary infiltrates. Findings consistent congestive heart failure. Small bilateral pleural effusions. No pneumothorax . IMPRESSION: 1. Lines and tubes in stable position. 2. Cardiomegaly with pulmonary venous congestion and bilateral pulmonary infiltrates consistent congestive heart failure. Bilateral pneumonia cannot be excluded. Electronically Signed   By: Maisie Fus  Register   On: 04/05/2015 07:18   Dg Chest Port 1 View  04/04/2015  CLINICAL DATA:  Hypoxia. Recent seizure. Central catheter placement EXAM: PORTABLE CHEST 1 VIEW COMPARISON:  Study obtained earlier in the day FINDINGS: Endotracheal tube tip is 6.6 cm above the carina. Central catheter tip is in the superior vena cava. Nasogastric tube tip and side port are in the stomach. No pneumothorax. There are areas of patchy consolidation in the right medial right base and left lower lobe regions. No new opacity. Heart is upper normal in size with pulmonary vascularity within normal limits. No adenopathy. No bone lesions. IMPRESSION: Stable patchy  areas of consolidation bilaterally. No new opacity. Tube and catheter positions as described without pneumothorax. Electronically Signed   By: Bretta Bang III M.D.   On: 04/04/2015 14:59   Dg Chest Port 1 View  04/04/2015  CLINICAL DATA:  Respiratory failure. EXAM: PORTABLE CHEST 1 VIEW COMPARISON:  04/03/2015 FINDINGS: New endotracheal tube and NG tube in stable position. Mediastinum hilar structures normal. Progressive bibasilar atelectasis and/or infiltrates. No pleural effusion or pneumothorax. IMPRESSION: 1. Endotracheal tube and NG tube in good anatomic position. 2. Progressive bibasilar atelectasis and/or infiltrates. Electronically Signed   By: Maisie Fus  Register   On: 04/04/2015 07:43   Dg Chest Portable 1 View  04/03/2015  CLINICAL DATA:  Seizure EXAM: PORTABLE CHEST 1 VIEW COMPARISON:  None FINDINGS: The ET tube tip is above the carina. There is a nasogastric tube with tip in the stomach. Normal heart size. No pleural effusion or edema. No airspace consolidation. IMPRESSION: 1. No acute cardiopulmonary abnormalities. 2. Satisfactory position of the support apparatus. Electronically Signed   By: Signa Kell M.D.   On: 04/03/2015 13:00   Dg Swallowing Func-speech Pathology  04/09/2015  Objective Swallowing Evaluation:   Patient Details Name: Malichi Palardy MRN: [id number] Date of Birth: 1941-02-04 Today's Date: 04/09/2015 Time: SLP Start Time (ACUTE ONLY): 1400-SLP Stop Time (ACUTE ONLY): 1417 SLP Time Calculation (min) (ACUTE ONLY): 17 min Past Medical History: @ Past Surgical History: Past Surgical History Procedure Laterality Date . Hip surgery     Left HPI: 75 y.o.male with PMH of Parkinson's/dementia dependent on family for ADL's. Last seen at 2 AM and then found out in the cold by family around 8AM. Brought to ER with core temp 23, seizing. Intubated on 04/04/15, extubated 04/07/15.  No Data Recorded Assessment / Plan / Recommendation CHL IP CLINICAL IMPRESSIONS 04/09/2015 Therapy  Diagnosis Mild to moderate oropharyngeal dysphagia  Clinical Impression Pt presents with mild to moderate oropharygeal dysphagia which is impacted by reduced mentation. Delay in swallow initiation observed with thin and nectar thick liquids to the level of the pyriform sinuses. Silent aspiration evidenced with thin liquids via straw sip. Pt with inconsistent deep penetration with thin liquids via cup sip, which increased when pt consumed larger bolus sizes. No aspiration with nectar thick liquids, however intermittent penetration was observed yet cleared with pts reflexive swallow. Pt with sensed deep penetration of mechanical soft consistencies that also cleared with cueing for hard cough and additonal swallow. Pharyngeal residuals noted to increase with mechanical soft textures, however liquid wash down was effective in decreasing residual amount. Given hx of aspiration PNA, reduced mentation, and decreased respiratory status recommend conservative diet implementation of dysphagia 1 (puree) and nectar thick liquids. Attempted whole barium tablet in puree during MBS, which pt chewed. Recommend medicines crushed in puree. Full supervision and feeding assistance warranted with all PO. ST to follow up and clinically upgrade diet as tolerated.   Impact on safety and function Mild aspiration risk;Moderate aspiration risk   CHL IP TREATMENT RECOMMENDATION 04/09/2015 Treatment Recommendations Therapy as outlined in treatment plan below   Prognosis 04/09/2015 Prognosis for Safe Diet Advancement Fair Barriers to Reach Goals Time post onset Barriers/Prognosis Comment -- CHL IP DIET RECOMMENDATION 04/09/2015 SLP Diet Recommendations Dysphagia 1 (Puree) solids;Nectar thick liquid Liquid Administration via Cup Medication Administration Crushed with puree Compensations Minimize environmental distractions;Slow rate;Small sips/bites;Follow solids with liquid Postural Changes Remain semi-upright after after feeds/meals  (Comment);Seated upright at 90 degrees   CHL IP OTHER RECOMMENDATIONS 04/09/2015 Recommended Consults -- Oral Care Recommendations Oral care BID Other Recommendations Order thickener from pharmacy   CHL IP FOLLOW UP RECOMMENDATIONS 04/09/2015 Follow up Recommendations 24 hour supervision/assistance   CHL IP FREQUENCY AND DURATION 04/09/2015 Speech Therapy Frequency (ACUTE ONLY) min 2x/week Treatment Duration 2 weeks      CHL IP ORAL PHASE 04/09/2015 Oral Phase Impaired Oral - Pudding Teaspoon -- Oral - Pudding Cup -- Oral - Honey Teaspoon -- Oral - Honey Cup -- Oral - Nectar Teaspoon -- Oral - Nectar Cup Weak lingual manipulation;Right anterior bolus loss;Lingual/palatal residue Oral - Nectar Straw -- Oral - Thin Teaspoon Weak lingual manipulation;Lingual/palatal residue Oral - Thin Cup Lingual/palatal residue;Weak lingual manipulation Oral - Thin Straw Weak lingual manipulation;Lingual/palatal residue Oral - Puree Weak lingual manipulation Oral - Mech Soft -- Oral - Regular Weak lingual manipulation;Impaired mastication;Piecemeal swallowing;Lingual/palatal residue;Decreased bolus cohesion Oral - Multi-Consistency -- Oral - Pill Impaired mastication;Weak lingual manipulation;Piecemeal swallowing;Lingual/palatal residue;Decreased bolus cohesion Oral Phase - Comment --  CHL IP PHARYNGEAL PHASE 04/09/2015 Pharyngeal Phase Impaired Pharyngeal- Pudding Teaspoon -- Pharyngeal -- Pharyngeal- Pudding Cup -- Pharyngeal -- Pharyngeal- Honey Teaspoon -- Pharyngeal -- Pharyngeal- Honey Cup -- Pharyngeal -- Pharyngeal- Nectar Teaspoon -- Pharyngeal -- Pharyngeal- Nectar Cup Delayed swallow initiation-pyriform sinuses;Penetration/Aspiration during swallow;Pharyngeal residue - valleculae;Pharyngeal residue - pyriform Pharyngeal Material enters airway, CONTACTS cords and then ejected out Pharyngeal- Nectar Straw -- Pharyngeal -- Pharyngeal- Thin Teaspoon Delayed swallow initiation-pyriform sinuses;Pharyngeal residue -  valleculae;Pharyngeal residue - pyriform Pharyngeal -- Pharyngeal- Thin Cup Penetration/Aspiration during swallow;Delayed swallow initiation-pyriform sinuses;Pharyngeal residue - valleculae;Pharyngeal residue - pyriform Pharyngeal Material enters airway, CONTACTS cords and not ejected out Pharyngeal- Thin Straw Penetration/Aspiration before swallow;Pharyngeal residue - valleculae;Pharyngeal residue - pyriform;Delayed swallow initiation-pyriform sinuses;Reduced airway/laryngeal closure Pharyngeal Material enters airway, passes BELOW  cords without attempt by patient to eject out (silent aspiration) Pharyngeal- Puree Delayed swallow initiation-vallecula;Pharyngeal residue - pyriform;Pharyngeal residue - valleculae Pharyngeal -- Pharyngeal- Mechanical Soft -- Pharyngeal -- Pharyngeal- Regular Penetration/Aspiration during swallow;Delayed swallow initiation-vallecula;Pharyngeal residue - valleculae Pharyngeal Material enters airway, CONTACTS cords and then ejected out Pharyngeal- Multi-consistency -- Pharyngeal -- Pharyngeal- Pill Delayed swallow initiation-vallecula;Pharyngeal residue - valleculae Pharyngeal -- Pharyngeal Comment --  No flowsheet data found. No flowsheet data found. Marcene Duos MA, CCC-SLP Acute Care Speech Language Pathologist  Kennieth Rad 04/09/2015, 2:52 PM                PERTINENT LAB RESULTS: CBC: No results for input(s): WBC, HGB, HCT, PLT in the last 72 hours. CMET CMP     Component Value Date/Time   NA 143 04/12/2015 0750   K 3.6 04/12/2015 0750   CL 111 04/12/2015 0750   CO2 25 04/12/2015 0750   GLUCOSE 119* 04/12/2015 0750   BUN 19 04/12/2015 0750   CREATININE 0.74 04/12/2015 0750   CALCIUM 8.9 04/12/2015 0750   PROT 5.0* 04/06/2015 0231   ALBUMIN 2.6* 04/06/2015 0231   AST 79* 04/06/2015 0231   ALT 14* 04/06/2015 0231   ALKPHOS 42 04/06/2015 0231   BILITOT 1.0 04/06/2015 0231   GFRNONAA >60 04/12/2015 0750   GFRAA >60 04/12/2015 0750    GFR Estimated  Creatinine Clearance: 79.6 mL/min (by C-G formula based on Cr of 0.74). No results for input(s): LIPASE, AMYLASE in the last 72 hours. No results for input(s): CKTOTAL, CKMB, CKMBINDEX, TROPONINI in the last 72 hours. Invalid input(s): POCBNP No results for input(s): DDIMER in the last 72 hours. No results for input(s): HGBA1C in the last 72 hours. No results for input(s): CHOL, HDL, LDLCALC, TRIG, CHOLHDL, LDLDIRECT in the last 72 hours. No results for input(s): TSH, T4TOTAL, T3FREE, THYROIDAB in the last 72 hours.  Invalid input(s): FREET3 No results for input(s): VITAMINB12, FOLATE, FERRITIN, TIBC, IRON, RETICCTPCT in the last 72 hours. Coags: No results for input(s): INR in the last 72 hours.  Invalid input(s): PT Microbiology: No results found for this or any previous visit (from the past 240 hour(s)).   BRIEF HOSPITAL COURSE:  Acute hypoxemic respiratory failure: Intubated on admission given inability to protect airway-subsequently with aspiration pneumonia. Extubated on 1/12, has been weaning off of oxygen via nasal cannula. Appears slightly weak and deconditioned, but is more alert, awake, and oriented today.  Hypothermia: Secondary to cold exposure-resolved.  Aspiration pneumonia: Remains febrile-no leukocytosis-Completed course of Unasyn. Blood culture 1/9 negative.  Dysphagia: Speech therapy following, underwent modified barium swallow on 1/14-initially on dysphagia 1 diet-but subsequently advanced to a dysphagia 3 diet. Spoke with patient's son at bedside on 1/5-explained risk of aspiration-currently on dysphagia 1 diet. Suspect could advance diet if deconditioning improves  Mild hypernatremia: Resolved.  Acute encephalopathy: Secondary to hypothermia/pneumonia-Parkinson's/dementia. Mental status much better-alert-and suspect very close to usual baseline.   Thrombocytopenia: Resolved.  Hypertension: Continue labetalol. PCP to follow  Parkinson's disease: Continue  Sinemet.   Dementia: Not sure if related to Parkinson's disease, resume Aricept/Lexapro  Deconditioning: Appeared very deconditioned during the past few days-likely secondary to acute illness-will be sent to SNF  TODAY-DAY OF DISCHARGE:  Subjective:   Rex Kras today has no headache,no chest abdominal pain,no new weakness tingling or numbness. Has started to be much more awake, with much more strength in the past few days.  Objective:   Blood pressure 131/71, pulse 63, temperature 98.3 F (36.8 C), temperature  source Oral, resp. rate 18, height  (1.727 m), weight 78.3 kg (172 lb 9.9 oz), SpO2 94 %.  Intake/Output Summary (Last 24 hours) at 04/14/15 1119 Last data filed at 04/14/15 0745  Gross per 24 hour  Intake    222 ml  Output   1820 ml  Net  -1598 ml   Filed Weights   04/12/15 0457 04/13/15 0613 04/14/15 0627  Weight: 81.1 kg (178 lb 12.7 oz) 81 kg (178 lb 9.2 oz) 78.3 kg (172 lb 9.9 oz)    Exam Awake Alert, Oriented *3, No new F.N deficits, Normal affect [place].AT,PERRAL Supple Neck,No JVD, No cervical lymphadenopathy appriciated.  Symmetrical Chest wall movement, Good air movement bilaterally, CTAB RRR,No Gallops,Rubs or new Murmurs, No Parasternal Heave +ve B.Sounds, Abd Soft, Non tender, No organomegaly appriciated, No rebound -guarding or rigidity. No Cyanosis, Clubbing or edema, No new Rash or bruise  DISCHARGE CONDITION: Stable  DISPOSITION: SNF  DISCHARGE INSTRUCTIONS:    Activity:  As tolerated with Full fall precautions use walker/cane & assistance as needed  Get Medicines reviewed and adjusted: Please take all your medications with you for your next visit with your Primary MD  Please request your Primary MD to go over all hospital tests and procedure/radiological results at the follow up, please ask your Primary MD to get all Hospital records sent to his/her office.  If you experience worsening of your admission symptoms, develop shortness of  breath, life threatening emergency, suicidal or homicidal thoughts you must seek medical attention immediately by calling 911 or calling your MD immediately  if symptoms less severe.  You must read complete instructions/literature along with all the possible adverse reactions/side effects for all the Medicines you take and that have been prescribed to you. Take any new Medicines after you have completely understood and accpet all the possible adverse reactions/side effects.   Do not drive when taking Pain medications.   Do not take more than prescribed Pain, Sleep and Anxiety Medications  Special Instructions: If you have smoked or chewed Tobacco  in the last 2 yrs please stop smoking, stop any regular Alcohol  and or any Recreational drug use.  Wear Seat belts while driving.  Please note  You were cared for by a hospitalist during your hospital stay. Once you are discharged, your primary care physician will handle any further medical issues. Please note that NO REFILLS for any discharge medications will be authorized once you are discharged, as it is imperative that you return to your primary care physician (or establish a relationship with a primary care physician if you do not have one) for your aftercare needs so that they can reassess your need for medications and monitor your lab values.   Diet recommendation: Diet recommendations: Dysphagia 3 (mechanical soft);Nectar-thick liquid;Other(comment) (ice chips ok) Liquids provided via: Cup;No straw Medication Administration: Whole meds with puree Supervision: Full supervision/cueing for compensatory strategies;Staff to assist with self feeding Compensations: Minimize environmental distractions;Slow rate;Small sips/bites;Follow solids with liquid (start meal with liquids) Postural Changes and/or Swallow Maneuvers: Seated upright 90 degrees;Upright 30-60 min after meal            Discharge Instructions    Call MD for:  difficulty  breathing, headache or visual disturbances    Complete by:  As directed      Call MD for:  temperature >100.4    Complete by:  As directed      Diet - low sodium heart healthy    Complete by:  As directed   Diet recommendations: Dysphagia 3 (mechanical soft);Nectar-thick liquid;Other(comment) (ice chips ok) Liquids provided via: Cup;No straw Medication Administration: Whole meds with puree Supervision: Full supervision/cueing for compensatory strategies;Staff to assist with self feeding Compensations: Minimize environmental distractions;Slow rate;Small sips/bites;Follow solids with liquid (start meal with liquids) Postural Changes and/or Swallow Maneuvers: Seated upright 90 degrees;Upright 30-60 min after meal     Increase activity slowly    Complete by:  As directed            Follow-up Information    Follow up with Angelica Chessman., MD. Schedule an appointment as soon as possible for a visit in 1 week.   Specialty:  Family Medicine   Why:  Post hospital follow-up   Contact information:   [address] [place] [address] [phone]       Total Time spent on discharge equals  45 minutes.  SignedJeoffrey Massed 04/14/2015 11:19 AM

## 2015-04-12 NOTE — Care Management Note (Signed)
Case Management Note  Patient Details  Name: Wadie Liew MRN: [id number] Date of Birth: [birth year]  Subjective/Objective:                 Patient from home with wife. Has sinemet pump. Patient wandered outside, and was hypothermic.   Action/Plan:  DC to SNF today as facilitated by CSW.  Expected Discharge Date:                  Expected Discharge Plan:  Skilled Nursing Facility  In-House Referral:  Clinical Social Work  Discharge planning Services  CM Consult  Post Acute Care Choice:    Choice offered to:     DME Arranged:    DME Agency:     HH Arranged:    HH Agency:     Status of Service:  Completed, signed off  Medicare Important Message Given:  Yes Date Medicare IM Given:    Medicare IM give by:    Date Additional Medicare IM Given:    Additional Medicare Important Message give by:     If discussed at Long Length of Stay Meetings, dates discussed:    Additional Comments:  Lawerance Sabal, RN 04/12/2015, 11:24 AM

## 2015-04-12 NOTE — Progress Notes (Deleted)
Physician Discharge Summary  Kyle Morrow ZOX:[id number] DOB: [birth year] DOA: 04/03/2015  PCP: Kyle Morrow., MD  Admit date: 04/03/2015 Discharge date: 04/12/2015  Time spent: 35 minutes  Recommendations for Outpatient Follow-up:  1. Please recheck CBC and BMET at next visit   Discharge Diagnoses:  Active Problems:   Hypothermia   Pressure ulcer   Acute respiratory failure (HCC)   Right elbow pain   Acute blood loss anemia   Acute encephalopathy   Dysphagia   Hyperglycemia   Hypernatremia   Discharge Condition: Stable, awake and alert  Diet recommendation: Low sodium, heart healthy  Filed Weights   04/10/15 0451 04/11/15 0508 04/12/15 0457  Weight: 87.3 kg (192 lb 7.4 oz) 87 kg (191 lb 12.8 oz) 81.1 kg (178 lb 12.7 oz)    History of present illness:  Kyle Morrow is a 75 yo make with PMH of Parkinson's disease with mild cognitive impairment, presented to ED via EMS after being found outside of his house. Patient was seizing and unresponsive. After evaluation, it was discovered the patient developed hypothermia and aspiration pneumonia. Patient has been deconditioned but is more awake, alert, and oriented today.  Hospital Course:  Active Problems: Acute hypoxemic respiratory failure: Intubated on admission given inability to protect airway-subsequently with aspiration pneumonia. Extubated on 1/12, has been weaning off of oxygen via nasal cannula. Appears slightly weak and deconditioned, but is more alert, awake, and oriented today.  Hypothermia: Secondary to cold exposure-resolved.  Aspiration pneumonia: Remains febrile-no leukocytosis-Completed course of Unasyn. Blood culture 1/9 negative.  Dysphagia: Speech therapy following, underwent modified barium swallow on 1/14-on dysphagia 1 diet. Spoke with patient's son at bedside on 1/5-explained risk of aspiration-currently on dysphagia 1 diet. Suspect could advance diet if deconditioning improves  Mild hypernatremia:  Resolved.  Acute encephalopathy: Secondary to hypothermia/pneumonia-Parkinson's/dementia. Mental status slowly improving-alert-and suspect very close to usual baseline.   Thrombocytopenia: Resolved.  Hypertension: Continue labetalol. PCP to follow  Parkinson's disease: Continue Sinemet.   Dementia: Not sure if related to Parkinson's disease, resume Aricept/Lexapro  Deconditioning: Appeared very deconditioned during the past few days-likely secondary to acute illness-will be sent to SNF  Procedures: None  Consultations: None  Discharge Exam: Filed Vitals:   04/11/15 2106 04/12/15 0457  BP: 138/70 155/93  Pulse: 73 69  Temp: 99.6 F (37.6 C) 99.1 F (37.3 C)  Resp: 18 18    General: Awake, alert, and oriented, in no acute distress Cardiovascular: S1, S2, regular, no murmurs Respiratory: CTA bilaterally  Musculoskeletal: UE and LE strength 4/5  Discharge Instructions Discharge Instructions    Call MD for:  difficulty breathing, headache or visual disturbances    Complete by:  As directed      Call MD for:  temperature >100.4    Complete by:  As directed      Diet - low sodium heart healthy    Complete by:  As directed      Increase activity slowly    Complete by:  As directed           Current Discharge Medication List    START taking these medications   Details  labetalol (NORMODYNE) 100 MG tablet Take 1 tablet (100 mg total) by mouth 2 (two) times daily.      CONTINUE these medications which have NOT CHANGED   Details  aspirin EC 325 MG tablet Take 325 mg by mouth daily as needed (pain).    Carbidopa-Levodopa (DUOPA EN) by Enteral route See admin instructions. Pump administers 4.63  mg/20 mg per ml - connect to portal every morning between 9am & 11am, disconnect 12-13 hours later. Rate is 3.4 mL/hr verified on pump.    carbidopa-levodopa (SINEMET IR) 25-100 MG tablet Take 2 tablets by mouth See admin instructions. Take 2 tablets by mouth when Duopa pump is  removed each evening    donepezil (ARICEPT) 5 MG tablet Take 5 mg by mouth at bedtime.    escitalopram (LEXAPRO) 10 MG tablet Take 10 mg by mouth daily.       No Known Allergies Follow-up Information    Follow up with Kyle Morrow., MD. Schedule an appointment as soon as possible for a visit in 1 week.   Specialty:  Family Medicine   Why:  Post hospital follow-up   Contact information:   [address] [place] [address] [phone]        The results of significant diagnostics from this hospitalization (including imaging, microbiology, ancillary and laboratory) are listed below for reference.    Significant Diagnostic Studies: Dg Elbow Complete Right  04/04/2015  CLINICAL DATA:  Intubated sedated patient with history of Parkinson's disease EXAM: RIGHT ELBOW - COMPLETE 3+ VIEW COMPARISON:  None in PACs FINDINGS: AP and lateral portable views of the elbow reveal no acute fracture or dislocation. There is mild soft tissue swelling over the olecranon and there is a tiny olecranon spur. A tiny bony irregularity along the posterior superior aspect of the olecranon is present. No joint effusion is evident. The radial head is intact. The distal humerus is unremarkable. IMPRESSION: Mild soft tissue swelling posteriorly may reflect olecranon bursitis with a small olecranon spur. A bony density along the extreme posterior superior aspect of the olecranon likely reflects an osteophyte though a tiny avulsion is not absolutely excluded. Correlation with any history of trauma is needed. The clinical history mentions a pressure ulcer although the side is not specified. Electronically Signed   By: David  [place] M.D.   On: 04/04/2015 07:46   Ct Head Wo Contrast  04/03/2015  CLINICAL DATA:  75 year old male found down outside EXAM: CT HEAD WITHOUT CONTRAST CT CERVICAL SPINE WITHOUT CONTRAST TECHNIQUE: Multidetector CT imaging of the head and cervical spine was performed following the standard  protocol without intravenous contrast. Multiplanar CT image reconstructions of the cervical spine were also generated. COMPARISON:  None. FINDINGS: CT HEAD FINDINGS Negative for acute intracranial hemorrhage, acute infarction, mass, mass effect, hydrocephalus or midline shift. Gray-white differentiation is preserved throughout. Mild cerebral cortical atrophy. Small well-defined low-attenuation focus in the right putaminal most consistent with a remote lacunar infarct. No focal scalp contusion or hematoma. Globes and orbits are symmetric and intact bilaterally. No focal calvarial fracture. Partial opacification of several scattered right-sided ethmoid air cells. Atherosclerotic calcifications in both cavernous carotid arteries. CT CERVICAL SPINE FINDINGS Moderate image degradation secondary to patient motion. No acute fracture, malalignment or prevertebral soft tissue swelling. Multilevel cervical spondylosis. Mild (2 mm) degenerative anterolisthesis of C5 on C6. Right greater than left facet arthropathy. The patient is intubated and a nasogastric tube are present. Unremarkable CT appearance of the thyroid gland. No acute soft tissue abnormality. The lung apices are unremarkable. IMPRESSION: CT HEAD 1. No acute intracranial abnormality. 2. Cerebral cortical atrophy. 3. Remote right basal ganglia lacunar infarct. 4. Atherosclerotic vascular calcifications in the intracranial carotid arteries. CT CSPINE 1. No evidence of acute fracture or malalignment. 2. Multilevel cervical spondylosis including mild degenerative anterolisthesis of C5 on C6. 3. Right greater than left facet  arthropathy. Electronically Signed   By: Malachy Moan M.D.   On: 04/03/2015 13:52   Ct Cervical Spine Wo Contrast  04/03/2015  CLINICAL DATA:  75 year old male found down outside EXAM: CT HEAD WITHOUT CONTRAST CT CERVICAL SPINE WITHOUT CONTRAST TECHNIQUE: Multidetector CT imaging of the head and cervical spine was performed following the  standard protocol without intravenous contrast. Multiplanar CT image reconstructions of the cervical spine were also generated. COMPARISON:  None. FINDINGS: CT HEAD FINDINGS Negative for acute intracranial hemorrhage, acute infarction, mass, mass effect, hydrocephalus or midline shift. Gray-white differentiation is preserved throughout. Mild cerebral cortical atrophy. Small well-defined low-attenuation focus in the right putaminal most consistent with a remote lacunar infarct. No focal scalp contusion or hematoma. Globes and orbits are symmetric and intact bilaterally. No focal calvarial fracture. Partial opacification of several scattered right-sided ethmoid air cells. Atherosclerotic calcifications in both cavernous carotid arteries. CT CERVICAL SPINE FINDINGS Moderate image degradation secondary to patient motion. No acute fracture, malalignment or prevertebral soft tissue swelling. Multilevel cervical spondylosis. Mild (2 mm) degenerative anterolisthesis of C5 on C6. Right greater than left facet arthropathy. The patient is intubated and a nasogastric tube are present. Unremarkable CT appearance of the thyroid gland. No acute soft tissue abnormality. The lung apices are unremarkable. IMPRESSION: CT HEAD 1. No acute intracranial abnormality. 2. Cerebral cortical atrophy. 3. Remote right basal ganglia lacunar infarct. 4. Atherosclerotic vascular calcifications in the intracranial carotid arteries. CT CSPINE 1. No evidence of acute fracture or malalignment. 2. Multilevel cervical spondylosis including mild degenerative anterolisthesis of C5 on C6. 3. Right greater than left facet arthropathy. Electronically Signed   By: Malachy Moan M.D.   On: 04/03/2015 13:52   Dg Pelvis Portable  04/03/2015  CLINICAL DATA:  Seizure EXAM: PORTABLE PELVIS 1-2 VIEWS COMPARISON:  None FINDINGS: There is a left hip arthroplasty device. Moderate degenerative changes are noted involving the right hip. Rectal temperature probe  noted. IMPRESSION: 1. No acute findings. Electronically Signed   By: Signa Kell M.D.   On: 04/03/2015 13:03   Dg Chest Port 1 View  04/06/2015  CLINICAL DATA:  Acute respiratory failure, Parkinson's disease, seizure activity, remote history of smoking. EXAM: PORTABLE CHEST 1 VIEW COMPARISON:  Portable chest x-ray of April 05, 2015 FINDINGS: The lungs are reasonably well inflated. There is bibasilar atelectasis greater on the right. No alveolar infiltrates are observed. The cardiac silhouette is mildly enlarged but stable. The central pulmonary vascularity is less prominent today. The endotracheal tube tip lies 4.8 cm above the carina. The esophagogastric tube tip projects below the inferior margin of the image. The left internal jugular venous catheter tip projects over the midportion of the SVC. The bony thorax is unremarkable where visualized. IMPRESSION: Interval improvement in bibasilar atelectasis or pneumonia. Mild stable cardiomegaly and improved central pulmonary vascular prominence. The support tubes are in reasonable position. Electronically Signed   By: David  [place] M.D.   On: 04/06/2015 06:55   Dg Chest Port 1 View  04/05/2015  CLINICAL DATA:  Acute respiratory failure. EXAM: PORTABLE CHEST 1 VIEW COMPARISON:  04/04/2015. FINDINGS: Endotracheal tube, left IJ line, NG tube in stable position . Cardiomegaly with pulmonary venous congestion . Bilateral pulmonary infiltrates. Findings consistent congestive heart failure. Small bilateral pleural effusions. No pneumothorax . IMPRESSION: 1. Lines and tubes in stable position. 2. Cardiomegaly with pulmonary venous congestion and bilateral pulmonary infiltrates consistent congestive heart failure. Bilateral pneumonia cannot be excluded. Electronically Signed   By: Maisie Fus  Register   On:  04/05/2015 07:18   Dg Chest Port 1 View  04/04/2015  CLINICAL DATA:  Hypoxia. Recent seizure. Central catheter placement EXAM: PORTABLE CHEST 1 VIEW COMPARISON:   Study obtained earlier in the day FINDINGS: Endotracheal tube tip is 6.6 cm above the carina. Central catheter tip is in the superior vena cava. Nasogastric tube tip and side port are in the stomach. No pneumothorax. There are areas of patchy consolidation in the right medial right base and left lower lobe regions. No new opacity. Heart is upper normal in size with pulmonary vascularity within normal limits. No adenopathy. No bone lesions. IMPRESSION: Stable patchy areas of consolidation bilaterally. No new opacity. Tube and catheter positions as described without pneumothorax. Electronically Signed   By: Bretta Bang III M.D.   On: 04/04/2015 14:59   Dg Chest Port 1 View  04/04/2015  CLINICAL DATA:  Respiratory failure. EXAM: PORTABLE CHEST 1 VIEW COMPARISON:  04/03/2015 FINDINGS: New endotracheal tube and NG tube in stable position. Mediastinum hilar structures normal. Progressive bibasilar atelectasis and/or infiltrates. No pleural effusion or pneumothorax. IMPRESSION: 1. Endotracheal tube and NG tube in good anatomic position. 2. Progressive bibasilar atelectasis and/or infiltrates. Electronically Signed   By: Maisie Fus  Register   On: 04/04/2015 07:43   Dg Chest Portable 1 View  04/03/2015  CLINICAL DATA:  Seizure EXAM: PORTABLE CHEST 1 VIEW COMPARISON:  None FINDINGS: The ET tube tip is above the carina. There is a nasogastric tube with tip in the stomach. Normal heart size. No pleural effusion or edema. No airspace consolidation. IMPRESSION: 1. No acute cardiopulmonary abnormalities. 2. Satisfactory position of the support apparatus. Electronically Signed   By: Signa Kell M.D.   On: 04/03/2015 13:00   Dg Swallowing Func-speech Pathology  04/09/2015  Objective Swallowing Evaluation:   Patient Details Name: Kyle Morrow MRN: [id number] Date of Birth: 09-22-1940 Today's Date: 04/09/2015 Time: SLP Start Time (ACUTE ONLY): 1400-SLP Stop Time (ACUTE ONLY): 1417 SLP Time Calculation (min) (ACUTE ONLY): 17  min Past Medical History: @ Past Surgical History: Past Surgical History Procedure Laterality Date . Hip surgery     Left HPI: 75 y.o.male with PMH of Parkinson's/dementia dependent on family for ADL's. Last seen at 2 AM and then found out in the cold by family around 8AM. Brought to ER with core temp 23, seizing. Intubated on 04/04/15, extubated 04/07/15.  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 Delay in swallow initiation observed with thin and nectar thick liquids to the level of the pyriform sinuses. Silent aspiration evidenced with thin liquids via straw sip. Pt with inconsistent deep penetration with thin liquids via cup sip, which increased when pt consumed larger bolus sizes. No aspiration with nectar thick liquids, however intermittent penetration was observed yet cleared with pts reflexive swallow. Pt with sensed deep penetration of mechanical soft consistencies that also cleared with cueing for hard cough and additonal swallow. Pharyngeal residuals noted to increase with mechanical soft textures, however liquid wash down was effective in decreasing residual amount. Given hx of aspiration PNA, reduced mentation, and decreased respiratory status recommend conservative diet implementation of dysphagia 1 (puree) and nectar thick liquids. Attempted whole barium tablet in puree during MBS, which pt chewed. Recommend medicines crushed in puree. Full supervision and feeding assistance warranted with all PO. ST to follow up and clinically upgrade diet as  tolerated.   Impact on safety and function Mild aspiration risk;Moderate aspiration risk   CHL IP TREATMENT RECOMMENDATION 04/09/2015 Treatment Recommendations Therapy as outlined in treatment plan below   Prognosis 04/09/2015 Prognosis  for Safe Diet Advancement Fair Barriers to Reach Goals Time post onset Barriers/Prognosis Comment -- CHL IP DIET RECOMMENDATION 04/09/2015 SLP Diet Recommendations Dysphagia 1 (Puree) solids;Nectar thick liquid Liquid Administration via Cup Medication Administration Crushed with puree Compensations Minimize environmental distractions;Slow rate;Small sips/bites;Follow solids with liquid Postural Changes Remain semi-upright after after feeds/meals (Comment);Seated upright at 90 degrees   CHL IP OTHER RECOMMENDATIONS 04/09/2015 Recommended Consults -- Oral Care Recommendations Oral care BID Other Recommendations Order thickener from pharmacy   CHL IP FOLLOW UP RECOMMENDATIONS 04/09/2015 Follow up Recommendations 24 hour supervision/assistance   CHL IP FREQUENCY AND DURATION 04/09/2015 Speech Therapy Frequency (ACUTE ONLY) min 2x/week Treatment Duration 2 weeks      CHL IP ORAL PHASE 04/09/2015 Oral Phase Impaired Oral - Pudding Teaspoon -- Oral - Pudding Cup -- Oral - Honey Teaspoon -- Oral - Honey Cup -- Oral - Nectar Teaspoon -- Oral - Nectar Cup Weak lingual manipulation;Right anterior bolus loss;Lingual/palatal residue Oral - Nectar Straw -- Oral - Thin Teaspoon Weak lingual manipulation;Lingual/palatal residue Oral - Thin Cup Lingual/palatal residue;Weak lingual manipulation Oral - Thin Straw Weak lingual manipulation;Lingual/palatal residue Oral - Puree Weak lingual manipulation Oral - Mech Soft -- Oral - Regular Weak lingual manipulation;Impaired mastication;Piecemeal swallowing;Lingual/palatal residue;Decreased bolus cohesion Oral - Multi-Consistency -- Oral - Pill Impaired mastication;Weak lingual manipulation;Piecemeal swallowing;Lingual/palatal residue;Decreased bolus cohesion Oral Phase - Comment --  CHL IP PHARYNGEAL PHASE 04/09/2015 Pharyngeal Phase Impaired Pharyngeal- Pudding Teaspoon -- Pharyngeal -- Pharyngeal- Pudding Cup -- Pharyngeal -- Pharyngeal- Honey Teaspoon -- Pharyngeal -- Pharyngeal- Honey Cup --  Pharyngeal -- Pharyngeal- Nectar Teaspoon -- Pharyngeal -- Pharyngeal- Nectar Cup Delayed swallow initiation-pyriform sinuses;Penetration/Aspiration during swallow;Pharyngeal residue - valleculae;Pharyngeal residue - pyriform Pharyngeal Material enters airway, CONTACTS cords and then ejected out Pharyngeal- Nectar Straw -- Pharyngeal -- Pharyngeal- Thin Teaspoon Delayed swallow initiation-pyriform sinuses;Pharyngeal residue - valleculae;Pharyngeal residue - pyriform Pharyngeal -- Pharyngeal- Thin Cup Penetration/Aspiration during swallow;Delayed swallow initiation-pyriform sinuses;Pharyngeal residue - valleculae;Pharyngeal residue - pyriform Pharyngeal Material enters airway, CONTACTS cords and not ejected out Pharyngeal- Thin Straw Penetration/Aspiration before swallow;Pharyngeal residue - valleculae;Pharyngeal residue - pyriform;Delayed swallow initiation-pyriform sinuses;Reduced airway/laryngeal closure Pharyngeal Material enters airway, passes BELOW cords without attempt by patient to eject out (silent aspiration) Pharyngeal- Puree Delayed swallow initiation-vallecula;Pharyngeal residue - pyriform;Pharyngeal residue - valleculae Pharyngeal -- Pharyngeal- Mechanical Soft -- Pharyngeal -- Pharyngeal- Regular Penetration/Aspiration during swallow;Delayed swallow initiation-vallecula;Pharyngeal residue - valleculae Pharyngeal Material enters airway, CONTACTS cords and then ejected out Pharyngeal- Multi-consistency -- Pharyngeal -- Pharyngeal- Pill Delayed swallow initiation-vallecula;Pharyngeal residue - valleculae Pharyngeal -- Pharyngeal Comment --  No flowsheet data found. No flowsheet data found. Marcene Duos MA, CCC-SLP Acute Care Speech Language Pathologist  Kennieth Rad 04/09/2015, 2:52 PM               Microbiology: Recent Results (from the past 240 hour(s))  MRSA PCR Screening     Status: None   Collection Time: 04/03/15  5:49 PM  Result Value Ref Range Status   MRSA by PCR NEGATIVE NEGATIVE  Final    Comment:        The GeneXpert MRSA Assay (FDA approved for NASAL specimens only), is one component of a comprehensive MRSA colonization surveillance program. It is not intended to diagnose MRSA infection nor to guide or monitor treatment for MRSA infections.  Culture, blood (Routine X 2) w Reflex to ID Panel     Status: None   Collection Time: 04/04/15  4:30 AM  Result Value Ref Range Status   Specimen Description BLOOD LEFT ARM  Final   Special Requests BOTTLES DRAWN AEROBIC AND ANAEROBIC 5CC  Final   Culture NO GROWTH 5 DAYS  Final   Report Status 04/09/2015 FINAL  Final  Culture, blood (Routine X 2) w Reflex to ID Panel     Status: None   Collection Time: 04/04/15  4:35 AM  Result Value Ref Range Status   Specimen Description BLOOD LEFT HAND  Final   Special Requests BOTTLES DRAWN AEROBIC AND ANAEROBIC 5CC  Final   Culture NO GROWTH 5 DAYS  Final   Report Status 04/09/2015 FINAL  Final  Culture, respiratory (NON-Expectorated)     Status: None   Collection Time: 04/04/15  6:34 AM  Result Value Ref Range Status   Specimen Description TRACHEAL ASPIRATE  Final   Special Requests NONE  Final   Gram Stain   Final    ABUNDANT WBC PRESENT,BOTH PMN AND MONONUCLEAR RARE SQUAMOUS EPITHELIAL CELLS PRESENT MODERATE GRAM POSITIVE COCCI IN PAIRS Performed at [facility]    Culture   Final    NORMAL OROPHARYNGEAL FLORA Performed at [facility]    Report Status 04/06/2015 FINAL  Final     Labs: Basic Metabolic Panel:  Recent Labs Lab 04/06/15 0231  04/08/15 0420 04/09/15 0845 04/10/15 0810 04/11/15 0607 04/12/15 0750  NA 144  < > 145 146* 146* 146* 143  K 3.3*  < > 3.7 3.9 3.7 3.6 3.6  CL 111  < > 107 108 111 115* 111  CO2 27  < > GLUCOSE 124*  < > 120* 138* 147* 142* 119*  BUN 14  < > CREATININE 0.82  < > 0.82 0.80 0.77 0.74 0.74  CALCIUM 8.2*  < > 9.1 9.1 8.9 8.8* 8.9  MG 2.0  --   --   --   --   --   --    PHOS 1.7*  --   --   --   --   --   --   < > = values in this interval not displayed. Liver Function Tests:  Recent Labs Lab 04/06/15 0231  AST 79*  ALT 14*  ALKPHOS 42  BILITOT 1.0  PROT 5.0*  ALBUMIN 2.6*   No results for input(s): LIPASE, AMYLASE in the last 168 hours. No results for input(s): AMMONIA in the last 168 hours. CBC:  Recent Labs Lab 04/06/15 0231 04/09/15 0845  WBC 8.8 7.3  NEUTROABS 6.3  --   HGB 11.5* 12.4*  HCT 34.3* 39.2  MCV 84.1 86.9  PLT 129* 168   Cardiac Enzymes: No results for input(s): CKTOTAL, CKMB, CKMBINDEX, TROPONINI in the last 168 hours. BNP: BNP (last 3 results) No results for input(s): BNP in the last 8760 hours.  ProBNP (last 3 results) No results for input(s): PROBNP in the last 8760 hours.  CBG:  Recent Labs Lab 04/11/15 1702 04/11/15 2011 04/12/15 0015 04/12/15 0432 04/12/15 0758  GLUCAP 109* 163* 112* 110* 119*       Signed: Hedda Slade, PA-S [facility] 04/12/2015, 11:19 AM

## 2015-04-12 NOTE — Progress Notes (Signed)
Inpatient Rehabilitation  I met with the patient, his son Eduard Clos and the patient's sister at the bedside to discuss pt's post acute rehab needs.  Pt. Sleeping following eating his breakfast.  Info was obtained from pt's son.  Charlie hoped to get his Dad to CIR for his rehab but is in agreement with SNF.  I have spoken with Dr. Sloan Leiter and with Cedric Fishman CSW over the phone and Carles Collet RNCM  this am. Per Dr. Laurena Bering, pt. Is medically ready to DC today.  As BCBS Medicare was closed yesterday, it is unlikely that I would get an insurance decision by the end of today regarding CIR.  I have explained this to the family and they understand and agree.  I will sign off.  Please call if questions.  [place] Admissions Coordinator Cell [phone] Office [phone]

## 2015-04-13 LAB — GLUCOSE, CAPILLARY
GLUCOSE-CAPILLARY: 112 mg/dL — AB (ref 65–99)
GLUCOSE-CAPILLARY: 135 mg/dL — AB (ref 65–99)
Glucose-Capillary: 127 mg/dL — ABNORMAL HIGH (ref 65–99)
Glucose-Capillary: 132 mg/dL — ABNORMAL HIGH (ref 65–99)

## 2015-04-13 MED FILL — Carbidopa-Levodopa Enteral Susp 4.63-20 MG/ML: ENTERAL | Qty: 700 | Status: AC

## 2015-04-13 NOTE — Progress Notes (Signed)
Patient seen and examined Vital signs remained stable Exam remains unchanged Remains stable for discharge-see discharge summary from yesterday Seen by speech therapy-diet upgraded to a dysphagia 3 diet. Social worker following for SNF placement

## 2015-04-13 NOTE — Clinical Social Work Note (Signed)
Clinical Social Work Assessment  Patient Details  Name: Hillman Attig MRN: [id number] Date of Birth: 1940-09-29  Date of referral:  04/11/15               Reason for consult:  Facility Placement                Permission sought to share information with:  Facility Sport and exercise psychologist, Family Supports Permission granted to share information::  No (Patient is disoriented; completed assessment with son)  Name::     [place]::  [facility] SNFs  Relationship::  Son  Contact Information:  [phone]  Housing/Transportation Living arrangements for the past 2 months:  [place] of Information:  Adult Children Patient Interpreter Needed:  None Criminal Activity/Legal Involvement Pertinent to Current Situation/Hospitalization:  No - Comment as needed Significant Relationships:  Adult Children, Siblings Lives with:  Adult Children Do you feel safe going back to the place where you live?  No Need for family participation in patient care:  Yes (Comment)  Care giving concerns:  CSW received referral for possible SNF placement at time of discharge. Patient was sleeping when CSW entered room. CSW met with patient's son at bedside regarding PT recommendation of SNF placement at time of discharge. Per patient's son, patient's son is currently unable to care for patient at their home given patient's current physical needs and fall risk. Patient's son expressed understanding of PT recommendation and are agreeable to SNF placement at time of discharge. CSW to continue to follow and assist with discharge planning needs.    Social Worker assessment / plan:  CSW spoke with patient's son concerning possibility of rehab at [facility] before returning home.  Employment status:  Retired Nurse, adult PT Recommendations:  [place] / Referral to community resources:  [place]  Patient/Family's Response to care:   Patient's son recognizes need for rehab before returning home and is agreeable to a SNF in [place].   Patient/Family's Understanding of and Emotional Response to Diagnosis, Current Treatment, and Prognosis:  Patient's son is realistic regarding therapy needs. No questions/concerns about plan or treatment.    Emotional Assessment Appearance:  Appears stated age Attitude/Demeanor/Rapport:   (Asleep) Affect (typically observed):   (Patient was asleep every time I visited.) Orientation:  Oriented to Self, Oriented to Place, Oriented to  Time Alcohol / Substance use:  Not Applicable Psych involvement (Current and /or in the community):  No (Comment)  Discharge Needs  Concerns to be addressed:  No discharge needs identified Readmission within the last 30 days:  No Current discharge risk:  None Barriers to Discharge:  No Barriers Identified   Benard Halsted, [place] 04/13/2015, 5:35 PM

## 2015-04-13 NOTE — Progress Notes (Signed)
Speech Language Pathology Treatment: Dysphagia  Patient Details Name: Kyle Morrow MRN: [id number] DOB: 01-Apr-1940 Today's Date: 04/13/2015 Time: 0805-0910 SLP Time Calculation (min) (ACUTE ONLY): 65 min  Assessment / Plan / Recommendation Clinical Impression  Pt in room with family (Charlie son and Billey Gosling sister present).  Pt sleeping with open mouth posture contributing greatly to xerostomia.  Pt observed consuming thin water, nectar water, pureed eggs, and graham crackers.  Throat clearing noted at baseline which worsened with intake.  Baseline throat clearing is normal per family.  Pt takes large boluses and needed encouragement to take small bites/sips.  NO overt coughing with intake however pt with silent aspiration on prior MBS.  Adequate mastication with solids noted - without residuals.    Recommend to advance diet to Dys3/nectar, allowing ice chips with strict precautions.    Follow up SLP indicated for dysphagia management.  Hopeful for pt swallow to return to baseline ability after full recovery from this event.  Suspect pt aspirated secretions during medical episode from speaking with pt/son.    SLP educated pt and family thoroughly to findings of MBS, clinical reasoning for diet modification/compensation strategies using teach back and written information.  Furhter advised pt maintain vocal/cough and "hock" strength for maximal airway protection.  Pt demonstrated strong phonation without cues!  Provided information re: the Wilhemina Bonito Protocol to son to use if indicated and SNF approves to assure pt stays hydrated.      HPI HPI: 75 y.o.male with PMH of Parkinson's/dementia dependent on family for ADL's. Last seen at 2 AM and then found out in the cold by family around 8AM. Brought to ER with core temp 23, seizing. Intubated on 04/04/15, extubated 04/07/15.  Pt and family deny pt with h/o dysphagia but sister does admit pt occasionally coughs with intake.  Chronic throat clearing  noted.        SLP Plan  Continue with current plan of care     Recommendations  Diet recommendations: Dysphagia 3 (mechanical soft);Nectar-thick liquid;Other(comment) (ice chips ok) Liquids provided via: Cup;No straw Medication Administration: Whole meds with puree Supervision: Full supervision/cueing for compensatory strategies;Staff to assist with self feeding Compensations: Minimize environmental distractions;Slow rate;Small sips/bites;Follow solids with liquid (start meal with liquids) Postural Changes and/or Swallow Maneuvers: Seated upright 90 degrees;Upright 30-60 min after meal             Oral Care Recommendations: Oral care BID Follow up Recommendations: 24 hour supervision/assistance Plan: Continue with current plan of care     GO               Donavan Burnet, MS [facility] SLP [phone]

## 2015-04-14 LAB — GLUCOSE, CAPILLARY
GLUCOSE-CAPILLARY: 107 mg/dL — AB (ref 65–99)
GLUCOSE-CAPILLARY: 112 mg/dL — AB (ref 65–99)
GLUCOSE-CAPILLARY: 137 mg/dL — AB (ref 65–99)
Glucose-Capillary: 117 mg/dL — ABNORMAL HIGH (ref 65–99)
Glucose-Capillary: 109 mg/dL — ABNORMAL HIGH (ref 65–99)

## 2015-04-14 NOTE — Progress Notes (Signed)
Report Called to [facility] spoke to [employer] LPN

## 2015-04-14 NOTE — Clinical Social Work Placement (Signed)
   CLINICAL SOCIAL WORK PLACEMENT  NOTE  Date:  04/14/2015  Patient Details  Name: Kyle Morrow MRN: [id number] Date of Birth: [birth year]  Clinical Social Work is seeking post-discharge placement for this patient at the Skilled  Nursing Facility level of care (*CSW will initial, date and re-position this form in  chart as items are completed):  Yes   Patient/family provided with [place] Clinical Social Work Department's list of facilities offering this level of care within the geographic area requested by the patient (or if unable, by the patient's family).  Yes   Patient/family informed of their freedom to choose among providers that offer the needed level of care, that participate in Medicare, Medicaid or managed care program needed by the patient, have an available bed and are willing to accept the patient.  Yes   Patient/family informed of [place]'s ownership interest in [facility] and [facility], as well as of the fact that they are under no obligation to receive care at these facilities.  PASRR submitted to EDS on 04/11/15     PASRR number received on 04/11/15     Existing PASRR number confirmed on       FL2 transmitted to all facilities in geographic area requested by pt/family on 04/11/15     FL2 transmitted to all facilities within larger geographic area on       Patient informed that his/her managed care company has contracts with or will negotiate with certain facilities, including the following:        Yes   Patient/family informed of bed offers received.  Patient chooses bed at [facility]     Physician recommends and patient chooses bed at      Patient to be transferred to [facility] on 04/14/15.  Patient to be transferred to facility by [facility]     Patient family notified on 04/14/15 of transfer.  Name of family member notified:  Son, [name]     PHYSICIAN       Additional Comment:     _______________________________________________ Mearl Latin, LCSWA 04/14/2015, 5:39 PM

## 2015-04-14 NOTE — Progress Notes (Signed)
Report called to [facility] spoke to Richarda Overlie LPN

## 2015-04-14 NOTE — Progress Notes (Signed)
Nutrition Follow-up  DOCUMENTATION CODES:   Not applicable  INTERVENTION:   -Continue Magic Cup TID with meals  NUTRITION DIAGNOSIS:   Inadequate oral intake related to dysphagia as evidenced by meal completion < 50%.  Progressing  GOAL:   Patient will meet greater than or equal to 90% of their needs  Progressing  MONITOR:   PO intake, Supplement acceptance, Diet advancement, Labs, Skin, I & O's  REASON FOR ASSESSMENT:   Consult Enteral/tube feeding initiation and management  ASSESSMENT:   Pt with hx of Parkinsons's/dementia and dependent on family for ADL's, uses carbidopa-levodopa through PEG. Admitted after being found out in the cold by family with hypothermia and seizures.   Pt transferred from ICU to medical floor on 04/08/15.   Pt in with multiple family members at time of visit. Reviewed SLP note from 04/13/15; pt was advanced to a dysphagia 3 diet with nectar thick liquids. Intake has improved; PO: 50-100% of meals.   Per MD notes, pt has been medically stable for discharge since 04/12/15. CSW following for SNF placement.   Labs reviewed: CBGS: 109-135.   Diet Order:  Diet - low sodium heart healthy DIET DYS 3 Room service appropriate?: Yes; Fluid consistency:: Nectar Thick  Skin:  Wound (see comment) (Unstageable wounds to elbow and both hands)  Last BM:  04/12/15  Height:   Ht Readings from Last 1 Encounters:  04/03/15  (1.727 m)    Weight:   Wt Readings from Last 1 Encounters:  04/14/15 172 lb 9.9 oz (78.3 kg)    Ideal Body Weight:  70 kg  BMI:  Body mass index is 26.25 kg/(m^2).  Estimated Nutritional Needs:   Kcal:  2000-2100  Protein:  100-115 grams  Fluid:  > 2 L/day  EDUCATION NEEDS:   No education needs identified at this time  Tomaz Janis A. Mayford Knife, RD, LDN, CDE Pager: [phone] After hours Pager: [phone]

## 2015-04-14 NOTE — Progress Notes (Signed)
Patient will DC to: [facility] Anticipated DC date: 04/14/15 Family notified: Son, Billey Gosling Transport by: [facility]  CSW signing off.  Cristobal Goldmann, [place] Clinical Social Worker [phone]

## 2015-04-14 NOTE — Progress Notes (Signed)
D/c to SNF via [facility]  

## 2015-04-14 NOTE — Progress Notes (Signed)
Physical Therapy Treatment Patient Details Name: Kyle Morrow MRN: [id number] DOB: [birth year] Today's Date: 04/14/2015    History of Present Illness Adm 04/03/15 after found down outside with hypothermia and seizures. Pt intubated 1/8-1/12. PMHx-Parkinson's with dementia and carbidopa pump (administered via PEG-like tube), Lt hip surgery     PT Comments    Pt demonstrated improvements in all aspects of functional mobility. Was able to take steps with +2 assist and RW to get to chair. Pt will benefit from continued PT in SNF setting. Son was in room, and related Pt was active and able to ambulate without cane and participating in spin class at [facility] tailored for PD patients.   Follow Up Recommendations  SNF     Equipment Recommendations  None recommended by PT    Recommendations for Other Services Rehab consult;OT consult     Precautions / Restrictions Precautions Precautions: Fall Restrictions Weight Bearing Restrictions: No    Mobility  Bed Mobility Overal bed mobility: Needs Assistance;+2 for physical assistance Bed Mobility: Rolling Rolling: +2 for physical assistance;Max assist Sidelying to sit: Mod assist;+2 for physical assistance       General bed mobility comments: Pt uses railing to reach and pull himself to sidelying and then to sitting with knees flexed over EOB. Pt requires support to get trunk upright.   Transfers Overall transfer level: Needs assistance Equipment used: Rolling walker (2 wheeled) Transfers: Sit to/from [employer] Sit to Stand: Mod assist;+2 physical assistance;+2 safety/equipment Stand pivot transfers: Mod assist;+2 physical assistance;+2 safety/equipment       General transfer comment: With +2 mod assist, Pt is able to stand from sitting EOB and then take small steps to pivot and sit in recliner. Pt requires verbal cues especially for his right foot placement when taking small steps. Pt also requires verbal cues for hand  placement and to tuck his hips under to help straighten his trunk.  Ambulation/Gait                 Stairs            Wheelchair Mobility    Modified Rankin (Stroke Patients Only)       Balance Overall balance assessment: Needs assistance Sitting-balance support: Bilateral upper extremity supported Sitting balance-Leahy Scale: Poor Sitting balance - Comments: Pt demonstrated ability to sit EOB with knees flexed and bilateral UE support on bed with min guard for safety.  Postural control: Posterior lean Standing balance support: Bilateral upper extremity supported Standing balance-Leahy Scale: Poor Standing balance comment: Pt is able to take small steps to pivot to sit in recliner. Pt required mod to max facilitational cues to move BOS anteriorly.                    Cognition Arousal/Alertness: Lethargic Behavior During Therapy: Flat affect Overall Cognitive Status: Within Functional Limits for tasks assessed                      Exercises General Exercises - Upper Extremity Shoulder Flexion: AAROM;Both;10 reps General Exercises - Lower Extremity Ankle Circles/Pumps: AROM;Both;20 reps Quad Sets: AROM;Both;10 reps Long Arc Quad:  (unable to complete on left leg) Heel Slides: AAROM;Both;10 reps Hip ABduction/ADduction: AAROM;10 reps;Both Straight Leg Raises: AAROM;Right;10 reps (unable to complete on left side ) Other Exercises Other Exercises: Bilateral cervical rotation  (3 repetitions ) Other Exercises: Scapular adduction with facilitation     General Comments General comments (skin integrity, edema, etc.): Pt has a red  looking rash on his back. His son was present, and reported that the pt did not always have this rash.      Pertinent Vitals/Pain Pain Assessment: No/denies pain    Home Living                      Prior Function            PT Goals (current goals can now be found in the care plan section) Acute Rehab PT  Goals PT Goal Formulation: With patient Time For Goal Achievement: 04/23/15 Potential to Achieve Goals: Good Progress towards PT goals: Progressing toward goals    Frequency  Min 3X/week    PT Plan Current plan remains appropriate    Co-evaluation             End of Session   Activity Tolerance: Patient limited by fatigue Patient left: in chair;with call bell/phone within reach;with family/visitor present;with chair alarm set     Time: 1426-1500 PT Time Calculation (min) (ACUTE ONLY): 34 min  Charges:    1 therapeutic activity, 1 therapeutic exercise                    G Codes:      Chase Picket 05/13/2015, 3:34 PM

## 2015-04-14 NOTE — Progress Notes (Signed)
Patient seen and examined-appears to be much more awake and alert than the past few days. Family at bedside. Vital signs stable Exam remains unchanged Remains stable for discharge-see discharge summary (updated today) Seen by speech therapy-diet upgraded to a dysphagia 3 diet. Social worker following for SNF placement

## 2015-04-20 ENCOUNTER — Non-Acute Institutional Stay (SKILLED_NURSING_FACILITY): Payer: Medicare Other | Admitting: Internal Medicine

## 2015-04-20 DIAGNOSIS — F0390 Unspecified dementia without behavioral disturbance: Secondary | ICD-10-CM | POA: Diagnosis not present

## 2015-04-20 DIAGNOSIS — R1310 Dysphagia, unspecified: Secondary | ICD-10-CM | POA: Diagnosis not present

## 2015-04-20 DIAGNOSIS — J690 Pneumonitis due to inhalation of food and vomit: Secondary | ICD-10-CM

## 2015-04-20 DIAGNOSIS — J9601 Acute respiratory failure with hypoxia: Secondary | ICD-10-CM | POA: Diagnosis not present

## 2015-04-20 DIAGNOSIS — G9340 Encephalopathy, unspecified: Secondary | ICD-10-CM

## 2015-04-20 DIAGNOSIS — I10 Essential (primary) hypertension: Secondary | ICD-10-CM | POA: Diagnosis not present

## 2015-04-20 DIAGNOSIS — G20 Parkinson's disease: Secondary | ICD-10-CM

## 2015-04-20 DIAGNOSIS — T68XXXD Hypothermia, subsequent encounter: Secondary | ICD-10-CM

## 2015-04-20 DIAGNOSIS — E870 Hyperosmolality and hypernatremia: Secondary | ICD-10-CM

## 2015-04-20 NOTE — Progress Notes (Signed)
MRN: [id number] Name: Kyle Morrow  Sex: male Age: 75 y.o. DOB: 11/19/1940  PSC #: Ronni Rumble Facility/Room:131A Level Of Care: SNF Provider: Merrilee Seashore D Emergency Contacts: Extended Emergency Contact Information Primary Emergency Contact: Risenhoover,Elizabeth Address: [address] [place], [place] [address] [place] Home Phone: [phone] Relation: Spouse Secondary Emergency Contact: Cordoba,Cadin  [place] Home Phone: [phone] Relation: Son  Code Status:   Allergies: Review of patient's allergies indicates no known allergies.  Chief Complaint  Patient presents with  . New Admit To SNF    HPI: Patient is 75 y.o. male with PMH of Parkinson's/dementia and dependent on family for ADL's. He uses a carbidopa-levodopa through PEG. He was last seen at 2 AM and then was found out in the cold by family this morning. Brought to ER cold, core temp 23, seizing. Pt was admitted to [facility] from 1/8-19 where he was treated for hypothermia, acute respiratory failure, aspiration PNA, dysphagia, acute encephalopathy and hypernatremia. Pt is now admitted to SNF for generalized weakness for OT/PT. While at SNF pt will be followed for HTN, tx with labatelol, Parkinsons tx with sinamet via pump and de,mentia, rx with aricept anfd lexapro.  Past Medical History  Diagnosis Date  . Parkinson disease [facility])     Past Surgical History  Procedure Laterality Date  . Hip surgery      Left      Medication List       This list is accurate as of: 04/20/15 11:59 PM.  Always use your most recent med list.               aspirin EC 325 MG tablet  Take 325 mg by mouth daily as needed (pain).     carbidopa-levodopa 25-100 MG tablet  Commonly known as:  SINEMET IR  Take 2 tablets by mouth See admin instructions. Take 2 tablets by mouth when Duopa pump is removed each evening     DUOPA EN  by Enteral route See admin instructions. Pump administers 4.63 mg/20  mg per ml - connect to portal every morning between 9am & 11am, disconnect 12-13 hours later. Rate is 3.4 mL/hr verified on pump.     donepezil 5 MG tablet  Commonly known as:  ARICEPT  Take 5 mg by mouth at bedtime.     escitalopram 10 MG tablet  Commonly known as:  LEXAPRO  Take 10 mg by mouth daily.     labetalol 100 MG tablet  Commonly known as:  NORMODYNE  Take 1 tablet (100 mg total) by mouth 2 (two) times daily.        No orders of the defined types were placed in this encounter.     There is no immunization history on file for this patient.  Social History  Substance Use Topics  . Smoking status: Former Smoker    Quit date: 03/26/1968  . Smokeless tobacco: Not on file  . Alcohol Use: No    Family history is + HTN   Review of Systems  DATA OBTAINED: from patient,son GENERAL:  no fevers, weakness+, appetite changes SKIN: No itching, rash or wounds EYES: No eye pain, redness, discharge EARS: No earache, tinnitus, change in hearing NOSE: No congestion, drainage or bleeding  MOUTH/THROAT: No mouth or tooth pain, No sore throat RESPIRATORY: No cough, wheezing, SOB CARDIAC: No chest pain, palpitations, lower extremity edema  GI: No abdominal pain, No N/V/D or constipation,  No heartburn or reflux  GU: No dysuria, frequency or urgency, or incontinence  MUSCULOSKELETAL: No unrelieved bone/joint pain NEUROLOGIC: No headache, dizziness or focal weakness PSYCHIATRIC: No c/o anxiety or sadness   Filed Vitals:   04/22/15 0912  BP: 132/78  Pulse: 70  Temp: 99.2 F (37.3 C)  Resp: 16    SpO2 Readings from Last 1 Encounters:  04/14/15 95%        Physical Exam  GENERAL APPEARANCE: Alert, mod conversant,  No acute distress.  SKIN: No diaphoresis rash HEAD: Normocephalic, atraumatic  EYES: Conjunctiva/lids clear. Pupils round, reactive. EOMs intact.  EARS: External exam WNL, canals clear. Hearing grossly normal.  NOSE: No deformity or discharge.   MOUTH/THROAT: Lips w/o lesions  RESPIRATORY: Breathing is even, unlabored. Lung sounds are clear   CARDIOVASCULAR: Heart RRR no murmurs, rubs or gallops. No peripheral edema.   GASTROINTESTINAL: Abdomen is soft, non-tender, not distended w/ normal bowel sounds. GENITOURINARY: Bladder non tender, not distended  MUSCULOSKELETAL: No abnormal joints or musculature NEUROLOGIC:  Cranial nerves 2-12 grossly intact. Moves all extremities; pt much less stiff than normal with parkinsons and minimal tremor  PSYCHIATRIC: Mood and affect appropriate to situation, no behavioral issues  Patient Active Problem List   Diagnosis Date Noted  . Aspiration pneumonia (HCC) 04/25/2015  . Dementia without behavioral disturbance 04/25/2015  . HTN (hypertension) 04/25/2015  . Acute blood loss anemia   . Acute encephalopathy   . Dysphagia   . Hyperglycemia   . Hypernatremia   . Acute respiratory failure with hypoxia (HCC)   . Right elbow pain   . Hypothermia 04/03/2015  . Pressure ulcer 04/03/2015  . Seizure (HCC)   . Acute respiratory failure with hypercapnia (HCC)   . Parkinson disease (HCC)     CBC    Component Value Date/Time   WBC 7.3 04/09/2015 0845   RBC 4.51 04/09/2015 0845   HGB 12.4* 04/09/2015 0845   HCT 39.2 04/09/2015 0845   PLT 168 04/09/2015 0845   MCV 86.9 04/09/2015 0845   LYMPHSABS 1.8 04/06/2015 0231   MONOABS 0.6 04/06/2015 0231   EOSABS 0.1 04/06/2015 0231   BASOSABS 0.0 04/06/2015 0231    CMP     Component Value Date/Time   NA 143 04/12/2015 0750   K 3.6 04/12/2015 0750   CL 111 04/12/2015 0750   CO2 25 04/12/2015 0750   GLUCOSE 119* 04/12/2015 0750   BUN 19 04/12/2015 0750   CREATININE 0.74 04/12/2015 0750   CALCIUM 8.9 04/12/2015 0750   PROT 5.0* 04/06/2015 0231   ALBUMIN 2.6* 04/06/2015 0231   AST 79* 04/06/2015 0231   ALT 14* 04/06/2015 0231   ALKPHOS 42 04/06/2015 0231   BILITOT 1.0 04/06/2015 0231   GFRNONAA >60 04/12/2015 0750   GFRAA >60 04/12/2015  0750    No results found for: HGBA1C   Dg Elbow Complete Right  04/04/2015  CLINICAL DATA:  Intubated sedated patient with history of Parkinson's disease EXAM: RIGHT ELBOW - COMPLETE 3+ VIEW COMPARISON:  None in PACs FINDINGS: AP and lateral portable views of the elbow reveal no acute fracture or dislocation. There is mild soft tissue swelling over the olecranon and there is a tiny olecranon spur. A tiny bony irregularity along the posterior superior aspect of the olecranon is present. No joint effusion is evident. The radial head is intact. The distal humerus is unremarkable. IMPRESSION: Mild soft tissue swelling posteriorly may reflect olecranon bursitis with a small olecranon spur. A bony density along the  extreme posterior superior aspect of the olecranon likely reflects an osteophyte though a tiny avulsion is not absolutely excluded. Correlation with any history of trauma is needed. The clinical history mentions a pressure ulcer although the side is not specified. Electronically Signed   By: David  [place] M.D.   On: 04/04/2015 07:46   Ct Head Wo Contrast  04/03/2015  CLINICAL DATA:  75 year old male found down outside EXAM: CT HEAD WITHOUT CONTRAST CT CERVICAL SPINE WITHOUT CONTRAST TECHNIQUE: Multidetector CT imaging of the head and cervical spine was performed following the standard protocol without intravenous contrast. Multiplanar CT image reconstructions of the cervical spine were also generated. COMPARISON:  None. FINDINGS: CT HEAD FINDINGS Negative for acute intracranial hemorrhage, acute infarction, mass, mass effect, hydrocephalus or midline shift. Gray-white differentiation is preserved throughout. Mild cerebral cortical atrophy. Small well-defined low-attenuation focus in the right putaminal most consistent with a remote lacunar infarct. No focal scalp contusion or hematoma. Globes and orbits are symmetric and intact bilaterally. No focal calvarial fracture. Partial opacification of several  scattered right-sided ethmoid air cells. Atherosclerotic calcifications in both cavernous carotid arteries. CT CERVICAL SPINE FINDINGS Moderate image degradation secondary to patient motion. No acute fracture, malalignment or prevertebral soft tissue swelling. Multilevel cervical spondylosis. Mild (2 mm) degenerative anterolisthesis of C5 on C6. Right greater than left facet arthropathy. The patient is intubated and a nasogastric tube are present. Unremarkable CT appearance of the thyroid gland. No acute soft tissue abnormality. The lung apices are unremarkable. IMPRESSION: CT HEAD 1. No acute intracranial abnormality. 2. Cerebral cortical atrophy. 3. Remote right basal ganglia lacunar infarct. 4. Atherosclerotic vascular calcifications in the intracranial carotid arteries. CT CSPINE 1. No evidence of acute fracture or malalignment. 2. Multilevel cervical spondylosis including mild degenerative anterolisthesis of C5 on C6. 3. Right greater than left facet arthropathy. Electronically Signed   By: Malachy Moan M.D.   On: 04/03/2015 13:52   Ct Cervical Spine Wo Contrast  04/03/2015  CLINICAL DATA:  75 year old male found down outside EXAM: CT HEAD WITHOUT CONTRAST CT CERVICAL SPINE WITHOUT CONTRAST TECHNIQUE: Multidetector CT imaging of the head and cervical spine was performed following the standard protocol without intravenous contrast. Multiplanar CT image reconstructions of the cervical spine were also generated. COMPARISON:  None. FINDINGS: CT HEAD FINDINGS Negative for acute intracranial hemorrhage, acute infarction, mass, mass effect, hydrocephalus or midline shift. Gray-white differentiation is preserved throughout. Mild cerebral cortical atrophy. Small well-defined low-attenuation focus in the right putaminal most consistent with a remote lacunar infarct. No focal scalp contusion or hematoma. Globes and orbits are symmetric and intact bilaterally. No focal calvarial fracture. Partial opacification of  several scattered right-sided ethmoid air cells. Atherosclerotic calcifications in both cavernous carotid arteries. CT CERVICAL SPINE FINDINGS Moderate image degradation secondary to patient motion. No acute fracture, malalignment or prevertebral soft tissue swelling. Multilevel cervical spondylosis. Mild (2 mm) degenerative anterolisthesis of C5 on C6. Right greater than left facet arthropathy. The patient is intubated and a nasogastric tube are present. Unremarkable CT appearance of the thyroid gland. No acute soft tissue abnormality. The lung apices are unremarkable. IMPRESSION: CT HEAD 1. No acute intracranial abnormality. 2. Cerebral cortical atrophy. 3. Remote right basal ganglia lacunar infarct. 4. Atherosclerotic vascular calcifications in the intracranial carotid arteries. CT CSPINE 1. No evidence of acute fracture or malalignment. 2. Multilevel cervical spondylosis including mild degenerative anterolisthesis of C5 on C6. 3. Right greater than left facet arthropathy. Electronically Signed   By: Malachy Moan M.D.   On: 04/03/2015  13:52   Dg Pelvis Portable  04/03/2015  CLINICAL DATA:  Seizure EXAM: PORTABLE PELVIS 1-2 VIEWS COMPARISON:  None FINDINGS: There is a left hip arthroplasty device. Moderate degenerative changes are noted involving the right hip. Rectal temperature probe noted. IMPRESSION: 1. No acute findings. Electronically Signed   By: Signa Kell M.D.   On: 04/03/2015 13:03   Dg Chest Port 1 View  04/04/2015  CLINICAL DATA:  Hypoxia. Recent seizure. Central catheter placement EXAM: PORTABLE CHEST 1 VIEW COMPARISON:  Study obtained earlier in the day FINDINGS: Endotracheal tube tip is 6.6 cm above the carina. Central catheter tip is in the superior vena cava. Nasogastric tube tip and side port are in the stomach. No pneumothorax. There are areas of patchy consolidation in the right medial right base and left lower lobe regions. No new opacity. Heart is upper normal in size with  pulmonary vascularity within normal limits. No adenopathy. No bone lesions. IMPRESSION: Stable patchy areas of consolidation bilaterally. No new opacity. Tube and catheter positions as described without pneumothorax. Electronically Signed   By: Bretta Bang III M.D.   On: 04/04/2015 14:59   Dg Chest Port 1 View  04/04/2015  CLINICAL DATA:  Respiratory failure. EXAM: PORTABLE CHEST 1 VIEW COMPARISON:  04/03/2015 FINDINGS: New endotracheal tube and NG tube in stable position. Mediastinum hilar structures normal. Progressive bibasilar atelectasis and/or infiltrates. No pleural effusion or pneumothorax. IMPRESSION: 1. Endotracheal tube and NG tube in good anatomic position. 2. Progressive bibasilar atelectasis and/or infiltrates. Electronically Signed   By: Maisie Fus  Register   On: 04/04/2015 07:43   Dg Chest Portable 1 View  04/03/2015  CLINICAL DATA:  Seizure EXAM: PORTABLE CHEST 1 VIEW COMPARISON:  None FINDINGS: The ET tube tip is above the carina. There is a nasogastric tube with tip in the stomach. Normal heart size. No pleural effusion or edema. No airspace consolidation. IMPRESSION: 1. No acute cardiopulmonary abnormalities. 2. Satisfactory position of the support apparatus. Electronically Signed   By: Signa Kell M.D.   On: 04/03/2015 13:00    Not all labs, radiology exams or other studies done during hospitalization come through on my EPIC note; however they are reviewed by me.    Assessment and Plan  Acute respiratory failure with hypoxia (HCC) Intubated on admission given inability to protect airway-subsequently with aspiration pneumonia. Extubated on 1/12, has been weaning off of oxygen via nasal cannula. Appears slightly weak and deconditioned; SNF - OT/PT  Hypothermia Secondary to cold exposure-resolved  Aspiration pneumonia (HCC) Remains febrile-no leukocytosis-Completed course of Unasyn. Blood culture 1/9 negative.   Dysphagia Speech therapy following, underwent modified  barium swallow on 1/14-initially on dysphagia 1 diet-but subsequently advanced to a dysphagia 3 diet. Spoke with patient's son at bedside on 1/5-explained risk of aspiration-currently on dysphagia 1 diet. Suspect could advance diet if deconditioning improves SNF - ST eval at SNF also  Acute encephalopathy Secondary to hypothermia/pneumonia-Parkinson's/dementia. Mental status much better-alert-and suspect very close to usual baseline.   Hypernatremia 2/2 dehydration;resolved;SNF - will f/u BMP  Dementia without behavioral disturbance SNF -  Not sure if related to Parkinson's disease, resume Aricept/Lexapro-  Parkinson disease (HCC) SNF - stable per son; cont carbodopa/levodopa per J tube pump  HTN (hypertension) SNF - chronic,stable;cont labatolol 100 mg BID   Time spent > 45 min;> 50% of time with patient was spent reviewing records, labs, tests and studies, counseling and developing plan of care  Margit Hanks, MD

## 2015-04-22 ENCOUNTER — Encounter: Payer: Self-pay | Admitting: Internal Medicine

## 2015-04-25 ENCOUNTER — Encounter: Payer: Self-pay | Admitting: Internal Medicine

## 2015-04-25 DIAGNOSIS — J690 Pneumonitis due to inhalation of food and vomit: Secondary | ICD-10-CM | POA: Insufficient documentation

## 2015-04-25 DIAGNOSIS — I10 Essential (primary) hypertension: Secondary | ICD-10-CM | POA: Insufficient documentation

## 2015-04-25 DIAGNOSIS — F0390 Unspecified dementia without behavioral disturbance: Secondary | ICD-10-CM | POA: Insufficient documentation

## 2015-04-25 NOTE — Assessment & Plan Note (Signed)
Intubated on admission given inability to protect airway-subsequently with aspiration pneumonia. Extubated on 1/12, has been weaning off of oxygen via nasal cannula. Appears slightly weak and deconditioned; SNF - OT/PT

## 2015-04-25 NOTE — Assessment & Plan Note (Signed)
SNF - chronic,stable;cont labatolol 100 mg BID

## 2015-04-25 NOTE — Assessment & Plan Note (Signed)
Speech therapy following, underwent modified barium swallow on 1/14-initially on dysphagia 1 diet-but subsequently advanced to a dysphagia 3 diet. Spoke with patient's son at bedside on 1/5-explained risk of aspiration-currently on dysphagia 1 diet. Suspect could advance diet if deconditioning improves SNF - ST eval at SNF also

## 2015-04-25 NOTE — Assessment & Plan Note (Signed)
Secondary to hypothermia/pneumonia-Parkinson's/dementia. Mental status much better-alert-and suspect very close to usual baseline.

## 2015-04-25 NOTE — Assessment & Plan Note (Signed)
SNF -  Not sure if related to Parkinson's disease, resume Aricept/Lexapro-

## 2015-04-25 NOTE — Assessment & Plan Note (Signed)
Remains febrile-no leukocytosis-Completed course of Unasyn. Blood culture 1/9 negative.

## 2015-04-25 NOTE — Assessment & Plan Note (Signed)
SNF - stable per son; cont carbodopa/levodopa per J tube pump

## 2015-04-25 NOTE — Assessment & Plan Note (Signed)
Secondary to cold exposure-resolved

## 2015-04-25 NOTE — Assessment & Plan Note (Signed)
2/2 dehydration;resolved;SNF - will f/u BMP

## 2015-04-27 ENCOUNTER — Inpatient Hospital Stay (HOSPITAL_COMMUNITY)
Admission: EM | Admit: 2015-04-27 | Discharge: 2015-05-25 | DRG: 296 | Disposition: E | Payer: Medicare Other | Attending: Internal Medicine | Admitting: Internal Medicine

## 2015-04-27 ENCOUNTER — Emergency Department (HOSPITAL_COMMUNITY): Payer: Medicare Other

## 2015-04-27 ENCOUNTER — Other Ambulatory Visit (HOSPITAL_COMMUNITY): Payer: Self-pay | Admitting: Internal Medicine

## 2015-04-27 ENCOUNTER — Inpatient Hospital Stay (HOSPITAL_COMMUNITY): Payer: Medicare Other

## 2015-04-27 DIAGNOSIS — G20 Parkinson's disease: Secondary | ICD-10-CM | POA: Diagnosis present

## 2015-04-27 DIAGNOSIS — F0280 Dementia in other diseases classified elsewhere without behavioral disturbance: Secondary | ICD-10-CM | POA: Diagnosis present

## 2015-04-27 DIAGNOSIS — I959 Hypotension, unspecified: Secondary | ICD-10-CM | POA: Diagnosis present

## 2015-04-27 DIAGNOSIS — I10 Essential (primary) hypertension: Secondary | ICD-10-CM | POA: Diagnosis present

## 2015-04-27 DIAGNOSIS — R739 Hyperglycemia, unspecified: Secondary | ICD-10-CM | POA: Diagnosis present

## 2015-04-27 DIAGNOSIS — R1310 Dysphagia, unspecified: Secondary | ICD-10-CM

## 2015-04-27 DIAGNOSIS — R001 Bradycardia, unspecified: Secondary | ICD-10-CM | POA: Diagnosis present

## 2015-04-27 DIAGNOSIS — R402432 Glasgow coma scale score 3-8, at arrival to emergency department: Secondary | ICD-10-CM | POA: Diagnosis present

## 2015-04-27 DIAGNOSIS — Z978 Presence of other specified devices: Secondary | ICD-10-CM

## 2015-04-27 DIAGNOSIS — N179 Acute kidney failure, unspecified: Secondary | ICD-10-CM | POA: Diagnosis present

## 2015-04-27 DIAGNOSIS — E872 Acidosis, unspecified: Secondary | ICD-10-CM | POA: Insufficient documentation

## 2015-04-27 DIAGNOSIS — J9601 Acute respiratory failure with hypoxia: Secondary | ICD-10-CM | POA: Diagnosis present

## 2015-04-27 DIAGNOSIS — G9340 Encephalopathy, unspecified: Secondary | ICD-10-CM | POA: Diagnosis present

## 2015-04-27 DIAGNOSIS — I469 Cardiac arrest, cause unspecified: Secondary | ICD-10-CM | POA: Diagnosis present

## 2015-04-27 DIAGNOSIS — D696 Thrombocytopenia, unspecified: Secondary | ICD-10-CM | POA: Diagnosis present

## 2015-04-27 LAB — COMPREHENSIVE METABOLIC PANEL
ALT: 12 U/L — AB (ref 17–63)
ANION GAP: 20 — AB (ref 5–15)
AST: 110 U/L — ABNORMAL HIGH (ref 15–41)
Albumin: 3.6 g/dL (ref 3.5–5.0)
Alkaline Phosphatase: 101 U/L (ref 38–126)
BUN: 22 mg/dL — ABNORMAL HIGH (ref 6–20)
CHLORIDE: 105 mmol/L (ref 101–111)
CO2: 18 mmol/L — AB (ref 22–32)
Calcium: 9.8 mg/dL (ref 8.9–10.3)
Creatinine, Ser: 1.65 mg/dL — ABNORMAL HIGH (ref 0.61–1.24)
GFR calc non Af Amer: 40 mL/min — ABNORMAL LOW (ref >60)
GFR, EST AFRICAN AMERICAN: 46 mL/min — AB (ref >60)
Glucose, Bld: 273 mg/dL — ABNORMAL HIGH (ref 65–99)
POTASSIUM: 5.5 mmol/L — AB (ref 3.5–5.1)
SODIUM: 143 mmol/L (ref 135–145)
Total Bilirubin: 0.6 mg/dL (ref 0.3–1.2)
Total Protein: 6.3 g/dL — ABNORMAL LOW (ref 6.5–8.1)

## 2015-04-27 LAB — CBC WITH DIFFERENTIAL/PLATELET
BASOS PCT: 0 %
Basophils Absolute: 0 10*3/uL (ref 0.0–0.1)
EOS ABS: 0.3 10*3/uL (ref 0.0–0.7)
Eosinophils Relative: 2 %
HCT: 45.5 % (ref 39.0–52.0)
Hemoglobin: 14.4 g/dL (ref 13.0–17.0)
Lymphocytes Relative: 35 %
Lymphs Abs: 4.6 10*3/uL — ABNORMAL HIGH (ref 0.7–4.0)
MCH: 28.9 pg (ref 26.0–34.0)
MCHC: 31.6 g/dL (ref 30.0–36.0)
MCV: 91.2 fL (ref 78.0–100.0)
MONO ABS: 0.4 10*3/uL (ref 0.1–1.0)
Monocytes Relative: 3 %
NEUTROS PCT: 60 %
Neutro Abs: 7.7 10*3/uL (ref 1.7–7.7)
PLATELETS: 136 10*3/uL — AB (ref 150–400)
RBC: 4.99 MIL/uL (ref 4.22–5.81)
RDW: 14.3 % (ref 11.5–15.5)
WBC: 13 10*3/uL — ABNORMAL HIGH (ref 4.0–10.5)

## 2015-04-27 LAB — URINALYSIS, ROUTINE W REFLEX MICROSCOPIC
GLUCOSE, UA: 100 mg/dL — AB
Ketones, ur: 15 mg/dL — AB
Nitrite: NEGATIVE
SPECIFIC GRAVITY, URINE: 1.022 (ref 1.005–1.030)
pH: 6 (ref 5.0–8.0)

## 2015-04-27 LAB — I-STAT ARTERIAL BLOOD GAS, ED
ACID-BASE DEFICIT: 15 mmol/L — AB (ref 0.0–2.0)
Bicarbonate: 16.1 mEq/L — ABNORMAL LOW (ref 20.0–24.0)
O2 SAT: 99 %
PH ART: 7.034 — AB (ref 7.350–7.450)
Patient temperature: 96.4
TCO2: 18 mmol/L (ref 0–100)
pCO2 arterial: 59.2 mmHg (ref 35.0–45.0)
pO2, Arterial: 171 mmHg — ABNORMAL HIGH (ref 80.0–100.0)

## 2015-04-27 LAB — I-STAT CHEM 8, ED
BUN: 31 mg/dL — AB (ref 6–20)
CALCIUM ION: 1.17 mmol/L (ref 1.13–1.30)
Chloride: 107 mmol/L (ref 101–111)
Creatinine, Ser: 1.2 mg/dL (ref 0.61–1.24)
Glucose, Bld: 259 mg/dL — ABNORMAL HIGH (ref 65–99)
HCT: 45 % (ref 39.0–52.0)
Hemoglobin: 15.3 g/dL (ref 13.0–17.0)
Potassium: 5 mmol/L (ref 3.5–5.1)
SODIUM: 140 mmol/L (ref 135–145)
TCO2: 18 mmol/L (ref 0–100)

## 2015-04-27 LAB — I-STAT CG4 LACTIC ACID, ED
Lactic Acid, Venous: 11.28 mmol/L (ref 0.5–2.0)
Lactic Acid, Venous: 8.6 mmol/L (ref 0.5–2.0)

## 2015-04-27 LAB — I-STAT TROPONIN, ED: TROPONIN I, POC: 0.06 ng/mL (ref 0.00–0.08)

## 2015-04-27 LAB — TROPONIN I: TROPONIN I: 0.03 ng/mL (ref <0.031)

## 2015-04-27 LAB — PROTIME-INR
INR: 1.83 — AB (ref 0.00–1.49)
PROTHROMBIN TIME: 21.1 s — AB (ref 11.6–15.2)

## 2015-04-27 LAB — URINE MICROSCOPIC-ADD ON

## 2015-04-27 LAB — LACTIC ACID, PLASMA: LACTIC ACID, VENOUS: 9.1 mmol/L — AB (ref 0.5–2.0)

## 2015-04-27 MED ORDER — SODIUM CHLORIDE 0.9 % IV SOLN: INTRAVENOUS | Status: DC

## 2015-04-27 MED ORDER — SODIUM CHLORIDE 0.9 % IV SOLN: 250.0000 mL | INTRAVENOUS | Status: DC | PRN

## 2015-04-27 MED ORDER — DEXTROSE 5 % IV SOLN
0.5000 ug/min | INTRAVENOUS | Status: DC
  Administered 2015-04-27: 5 ug/min via INTRAVENOUS
  Administered 2015-04-28: 15 ug/min via INTRAVENOUS
  Filled 2015-04-27 (×2): qty 4

## 2015-04-27 MED ORDER — DONEPEZIL HCL 5 MG PO TABS
5.0000 mg | ORAL_TABLET | Freq: Every day | ORAL | Status: DC
  Filled 2015-04-27: qty 1

## 2015-04-27 MED ORDER — VANCOMYCIN HCL IN DEXTROSE 750-5 MG/150ML-% IV SOLN
750.0000 mg | Freq: Two times a day (BID) | INTRAVENOUS | Status: DC
  Filled 2015-04-27 (×2): qty 150

## 2015-04-27 MED ORDER — PANTOPRAZOLE SODIUM 40 MG IV SOLR
40.0000 mg | Freq: Every day | INTRAVENOUS | Status: DC
  Administered 2015-04-27: 40 mg via INTRAVENOUS
  Filled 2015-04-27: qty 40

## 2015-04-27 MED ORDER — MIDAZOLAM HCL 2 MG/2ML IJ SOLN: 1.0000 mg | INTRAMUSCULAR | Status: DC | PRN

## 2015-04-27 MED ORDER — HEPARIN SODIUM (PORCINE) 5000 UNIT/ML IJ SOLN
5000.0000 [IU] | Freq: Three times a day (TID) | INTRAMUSCULAR | Status: DC
  Administered 2015-04-27: 5000 [IU] via SUBCUTANEOUS
  Filled 2015-04-27: qty 1

## 2015-04-27 MED ORDER — NOREPINEPHRINE BITARTRATE 1 MG/ML IV SOLN
0.0000 ug/min | INTRAVENOUS | Status: DC
  Administered 2015-04-27: 5 ug/min via INTRAVENOUS
  Filled 2015-04-27 (×2): qty 4

## 2015-04-27 MED ORDER — FENTANYL CITRATE (PF) 100 MCG/2ML IJ SOLN: 50.0000 ug | INTRAMUSCULAR | Status: DC | PRN

## 2015-04-27 MED ORDER — VANCOMYCIN HCL IN DEXTROSE 1-5 GM/200ML-% IV SOLN
1000.0000 mg | Freq: Once | INTRAVENOUS | Status: DC
  Filled 2015-04-27: qty 200

## 2015-04-27 MED ORDER — EPINEPHRINE HCL 0.1 MG/ML IJ SOSY
PREFILLED_SYRINGE | INTRAMUSCULAR | Status: AC | PRN
  Administered 2015-04-27 (×2): 1 mg via INTRAVENOUS

## 2015-04-27 MED ORDER — PIPERACILLIN-TAZOBACTAM 3.375 G IVPB
3.3750 g | Freq: Three times a day (TID) | INTRAVENOUS | Status: DC
  Filled 2015-04-27 (×3): qty 50

## 2015-04-27 MED ORDER — INSULIN ASPART 100 UNIT/ML ~~LOC~~ SOLN
0.0000 [IU] | SUBCUTANEOUS | Status: DC
  Administered 2015-04-28: 11 [IU] via SUBCUTANEOUS

## 2015-04-27 MED ORDER — SODIUM CHLORIDE 0.9 % IV SOLN
INTRAVENOUS | Status: DC | PRN
  Administered 2015-04-27: 1000 mL/h via INTRAVENOUS

## 2015-04-27 MED ORDER — SODIUM BICARBONATE 8.4 % IV SOLN
INTRAVENOUS | Status: AC | PRN
  Administered 2015-04-27: 1 meq via INTRAVENOUS

## 2015-04-27 MED ORDER — ASPIRIN 325 MG PO TABS: 325.0000 mg | ORAL_TABLET | Freq: Every day | ORAL | Status: DC

## 2015-04-27 MED ORDER — SODIUM CHLORIDE 0.9 % IV SOLN
INTRAVENOUS | Status: AC | PRN
  Administered 2015-04-27: 1000 mL via INTRAVENOUS

## 2015-04-27 MED ORDER — PIPERACILLIN-TAZOBACTAM 3.375 G IVPB
3.3750 g | Freq: Once | INTRAVENOUS | Status: AC
  Administered 2015-04-27: 3.375 g via INTRAVENOUS
  Filled 2015-04-27: qty 50

## 2015-04-27 NOTE — Code Documentation (Signed)
Per eMS:  Pt from nursing home where he was for Rehab after having pneumonia.  Staff saw patient awake at 1830.  EMS called at 1935 for CPR in progress.  Please see previous note about PTA

## 2015-04-27 NOTE — Code Documentation (Signed)
Per EMS:   EMS called out at 1925.  Arrived on scene approx 6 minutes later.  1 shock delivered to patient before arrival.  8 Epi's with 2 ROSC achieved during transport

## 2015-04-27 NOTE — Code Documentation (Signed)
Currently setting up for central line insertion

## 2015-04-27 NOTE — ED Provider Notes (Signed)
CSN: [id number]     Arrival date & time 04/29/2015  2039 History   First MD Initiated Contact with Patient 05/17/2015 2117     Chief Complaint  Patient presents with  . Cardiac Arrest     (Consider location/radiation/quality/duration/timing/severity/associated sxs/prior Treatment) HPI   39 y m w recent hospital admission for extreme hypothermia who was discharged to rehab.  Has been doing well in rehab and had no complaints during the day today.  Patient had been seen doing well and shortly after per ems report he was found down unresponsive.  CPR was started and AED attached.  AED administered one shock.  When ems arrived, pt pulseless in asystole.  Mult rounds of cpr w/ epi x 6.  Ems achieved rosc.  On transferring over to our bed, patient again lost pulses.  No past medical history on file. No past surgical history on file. No family history on file. Social History  Substance Use Topics  . Smoking status: Not on file  . Smokeless tobacco: Not on file  . Alcohol Use: Not on file    Review of Systems  Unable to perform ROS: Patient unresponsive      Allergies  Review of patient's allergies indicates not on file.  Home Medications   Prior to Admission medications   Not on File   BP 121/84 mmHg  Pulse 107  Temp(Src) 96.4 F (35.8 C) (Temporal)  Resp 24  SpO2 100% Physical Exam  HENT:  Head: Normocephalic and atraumatic.  ett in place  Eyes: EOM are normal. Pupils are equal, round, and reactive to light.  Neck: Normal range of motion. Neck supple.  Cardiovascular: Normal rate.   Pulmonary/Chest: Effort normal. No respiratory distress.  Abdominal: Soft. There is no tenderness.  Musculoskeletal: Normal range of motion.  Neurological:  Motionless, 4mm pupils that are reactive No response to pain  Skin: No rash noted.    ED Course  .Central Line Date/Time: 05/07/2015 10:08 PM Performed by: Silas Flood Authorized by: Silas Flood Consent: The procedure was  performed in an emergent situation. Indications: vascular access Preparation: skin prepped with 2% chlorhexidine Skin prep agent dried: skin prep agent completely dried prior to procedure Hand hygiene: hand hygiene performed prior to central venous catheter insertion Location details: left femoral Patient position: flat Catheter type: triple lumen Ultrasound guidance: yes Number of attempts: 2 Successful placement: yes Post-procedure: line sutured and dressing applied Assessment: blood return through all ports and free fluid flow  ARTERIAL LINE Date/Time: 05/18/2015 10:09 PM Performed by: Silas Flood Authorized by: Silas Flood Consent: The procedure was performed in an emergent situation. Indications: respiratory failure and hemodynamic monitoring Location: right femoral Needle gauge: 22 Seldinger technique: Seldinger technique used Number of attempts: 1 Post-procedure: line sutured and dressing applied Patient tolerance: Patient tolerated the procedure well with no immediate complications   (including critical care time) Labs Review Labs Reviewed  CBC WITH DIFFERENTIAL/PLATELET - Abnormal; Notable for the following:    WBC 13.0 (*)    Platelets 136 (*)    Lymphs Abs 4.6 (*)    All other components within normal limits  COMPREHENSIVE METABOLIC PANEL - Abnormal; Notable for the following:    Potassium 5.5 (*)    CO2 18 (*)    Glucose, Bld 273 (*)    BUN 22 (*)    Creatinine, Ser 1.65 (*)    Total Protein 6.3 (*)    AST 110 (*)    ALT 12 (*)    GFR  calc non Af Amer 40 (*)    GFR calc Af Amer 46 (*)    Anion gap 20 (*)    All other components within normal limits  PROTIME-INR - Abnormal; Notable for the following:    Prothrombin Time 21.1 (*)    INR 1.83 (*)    All other components within normal limits  URINALYSIS, ROUTINE W REFLEX MICROSCOPIC (NOT AT [facility]) - Abnormal; Notable for the following:    Color, Urine AMBER (*)    APPearance TURBID (*)    Glucose, UA 100  (*)    Hgb urine dipstick LARGE (*)    Bilirubin Urine SMALL (*)    Ketones, ur 15 (*)    Protein, ur >300 (*)    Leukocytes, UA TRACE (*)    All other components within normal limits  URINE MICROSCOPIC-ADD ON - Abnormal; Notable for the following:    Squamous Epithelial / LPF 0-5 (*)    Bacteria, UA MANY (*)    Casts HYALINE CASTS (*)    All other components within normal limits  I-STAT CHEM 8, ED - Abnormal; Notable for the following:    BUN 31 (*)    Glucose, Bld 259 (*)    All other components within normal limits  I-STAT CG4 LACTIC ACID, ED - Abnormal; Notable for the following:    Lactic Acid, Venous 11.28 (*)    All other components within normal limits  I-STAT ARTERIAL BLOOD GAS, ED - Abnormal; Notable for the following:    pH, Arterial 7.034 (*)    pCO2 arterial 59.2 (*)    pO2, Arterial 171.0 (*)    Bicarbonate 16.1 (*)    Acid-base deficit 15.0 (*)    All other components within normal limits  I-STAT CG4 LACTIC ACID, ED - Abnormal; Notable for the following:    Lactic Acid, Venous 8.60 (*)    All other components within normal limits  CULTURE, BLOOD (ROUTINE X 2)  CULTURE, BLOOD (ROUTINE X 2)  CULTURE, RESPIRATORY (NON-EXPECTORATED)  CBC  BASIC METABOLIC PANEL  BLOOD GAS, ARTERIAL  MAGNESIUM  PHOSPHORUS  TROPONIN I  TROPONIN I  TROPONIN I  LACTIC ACID, PLASMA  LACTIC ACID, PLASMA  LACTIC ACID, PLASMA  PROCALCITONIN  PROCALCITONIN  HEMOGLOBIN A1C  I-STAT TROPOININ, ED    Imaging Review Dg Chest Portable 1 View  05/12/2015  CLINICAL DATA:  Endotracheal and NG tube placement. EXAM: PORTABLE CHEST 1 VIEW COMPARISON:  None. FINDINGS: Endotracheal tube 5.9 cm from the carina. Enteric tube is in place, tip below the diaphragm not included in the field of view. Multiple overlying monitoring devices. Lung volumes are low. There is bibasilar atelectasis, right greater than left. Heart appears mildly enlarged, suboptimally assessed on the current exam. No pulmonary  edema, large pleural effusion or pneumothorax. IMPRESSION: 1. Endotracheal and enteric tubes in place. 2. Low lung volumes with bibasilar atelectasis. Electronically Signed   By: Rubye Oaks M.D.   On: 05/12/2015 21:35   I have personally reviewed and evaluated these images and lab results as part of my medical decision-making.   EKG Interpretation None      MDM   Final diagnoses:  Cardiac arrest (HCC)  Lactic acidosis  Acute respiratory failure with hypoxia (HCC)    45 y m w recent hospital admission for extreme hypothermia who was discharged to rehab and presents now with cardiac arrest with ROSC  Exam as above.  Pt arrested just PTA.  ACLS started and epi/bicarb given with ROSC  after one round cpr.  Patient was then started on levophed gtt and continued to drop pressures so epi gtt was started with improvement in bp.   Airway intact with ett in place. BS equal bilaterally being bagged Crash a-line and L femoral cvc placed.  ekg after rosc without stemi.  Cbc/cmp/lactic obtained and profound lactic acidosis.  Will start broad spectrum abx, fluid resuscitation.  cxr shows good position of ett.  Unsure of the etiology of the arrest, it was sudden without any reported prodromal sx making arrhythmia a possibility.      Silas Flood, MD 05/23/2015 2956  Margarita Grizzle, MD 05/12/2015 2337

## 2015-04-27 NOTE — Progress Notes (Signed)
Chaplain met family and escorted them to the consultation room. Spiritual conversation and comfort measures.  Accompanied MD to update family   Will follow.  [place], [place]

## 2015-04-27 NOTE — Procedures (Addendum)
Arterial Catheter Insertion Procedure Note Kyle Morrow [id number] 1940-03-29  Procedure: Insertion of Arterial Catheter  Indications: Blood pressure monitoring  Procedure Details Consent: Unable to obtain consent because of emergent medical necessity. Time Out: Verified patient identification, verified procedure, site/side was marked, verified correct patient position, special equipment/implants available, medications/allergies/relevent history reviewed, required imaging and test results available.  Performed  Maximum sterile technique was used including antiseptics, cap, gloves, gown, hand hygiene, mask and sheet. Skin prep: Chlorhexidine; local anesthetic administered 20 gauge catheter was inserted into right femoral artery using the Seldinger technique.  Evaluation Blood flow good; BP tracing good. Complications: No apparent complications. Emergency MD placed femoral line in ED Trauma room. RT assisted with placement. 2115   [name] 05/13/2015

## 2015-04-27 NOTE — Progress Notes (Signed)
Pharmacy Antibiotic Note  Kyle Morrow is a 75 y.o. male admitted on 05/03/2015 with sepsis.  Pharmacy has been consulted for vancomycin and zosyn dosing.  Pt received vancomycin 1g and zosyn 3.375g IV once in the ED.  Plan: Vancomycin  IV every 12 hours.  Goal trough 15-20 mcg/mL.  Zosyn 3.375g IV q8h Monitor culture data, renal function and clinical course     Temp (24hrs), Avg:96.4 F (35.8 C), Min:96.4 F (35.8 C), Max:96.4 F (35.8 C)   Recent Labs Lab 05/03/15 2054 05/03/2015 2106 2015/05/03 2216  WBC 13.0*  --   --   CREATININE 1.65* 1.20  --   LATICACIDVEN  --  11.28* 8.60*    CrCl cannot be calculated (Unknown ideal weight.).    Not on File  Antimicrobials this admission: Vanc 2/1 >>  Zosyn 2/1 >>   Dose adjustments this admission:   Microbiology results:  BCx:   UCx:    Sputum:    MRSA PCR:    Arlean Hopping. Newman Pies, PharmD, BCPS Clinical Pharmacist Pager [phone] 05/03/15 10:27 PM

## 2015-04-27 NOTE — H&P (Signed)
PULMONARY / CRITICAL CARE MEDICINE   Name: Kyle Morrow MRN: [id number] DOB: 11-Apr-1940    ADMISSION DATE:  05/07/2015 CONSULTATION DATE:    REFERRING MD:  EDP  CHIEF COMPLAINT:  Cardiac Arrest  HISTORY OF PRESENT ILLNESS:  Pt is encephelopathic; therefore, this HPI is obtained from chart review. Kyle Morrow is a 75 y.o. male with PMH of Parkinson's and Dementia.  He had recent admission 04/03/15 through 04/14/15 for aspiration PNA requiring intubation and hypothermia due to cold exposure (was found outside in the snow). Following that admission, he was discharged to [facility] and per family, he had been doing well there.  On evening of , he was last seen normal at 1630.  At 1935, staff found him pulseless.  Unknown downtime.  EMS was dispatched and on their arrival, CPR was in progress and AED had fired once.  EMS administered 8mg  epinephrine and 1 defibrillation.  Upon arrival to [facility] ED, pt had lost pulses again and was given 1mg  epinephrine and 1amp HCO3.  He was profoundly bradycardic after ROSC and was therefore started on epinephrine infusion.  He was also started on levophed infusion for hypotension.  PCCM was called for admission.  I have had extensive discussions with pt's wife Mrs. Hanton and two sons (although note that one son is mentally disabled). We discussed Mr. Patlan current circumstances and organ failures. We also discussed patient's prior wishes under circumstances such as this. The family has decided not to perform resuscitation if arrest were to occur, but to otherwise continue with current medical support / therapies without further escalation.   PAST MEDICAL HISTORY :  He  has no past medical history on file.  PAST SURGICAL HISTORY: He  has no past surgical history on file.  Not on File  No current facility-administered medications on file prior to encounter.   No current outpatient prescriptions on file prior to encounter.    FAMILY  HISTORY:  His has no family status information on file.   SOCIAL HISTORY: He    REVIEW OF SYSTEMS:   Unable to obtain as pt is encephalopathic.  SUBJECTIVE:  On vent, remains completely unresponsive despite no sedation.  VITAL SIGNS: BP 119/87 mmHg  Pulse 102  Temp(Src) 96.4 F (35.8 C) (Temporal)  Resp 24  SpO2 100%  HEMODYNAMICS:    VENTILATOR SETTINGS: Vent Mode:  [-] PRVC FiO2 (%):  [100 %] 100 % Set Rate:  [18 bmp-24 bmp] 24 bmp Vt Set:  [520 mL-550 mL] 550 mL PEEP:  [5 cmH20] 5 cmH20 Plateau Pressure:  [27 cmH20] 27 cmH20  INTAKE / OUTPUT:     PHYSICAL EXAMINATION: General: Adult male, in NAD. Neuro: Non-responsive (despite no sedation).  GCS 3. HEENT: [place]/AT. PERRL, sclerae anicteric. Cardiovascular: RRR, no M/R/G.  Lungs: Respirations even and unlabored.  CTA bilaterally, No W/R/R. Abdomen: PEG in place.  BS x 4, soft, NT/ND.  Musculoskeletal: No gross deformities, no edema.  Skin: Intact, warm, no rashes.  LABS:  BMET  Recent Labs Lab 05/19/2015 2054 05/10/2015 2106  NA 143 140  K 5.5* 5.0  CL 105 107  CO2 18*  --   BUN 22* 31*  CREATININE 1.65* 1.20  GLUCOSE 273* 259*    Electrolytes  Recent Labs Lab 05/13/2015 2054  CALCIUM 9.8    CBC  Recent Labs Lab 05/12/2015 2054 05/08/2015 2106  WBC 13.0*  --   HGB 14.4 15.3  HCT 45.5 45.0  PLT 136*  --     Coag's  Recent Labs Lab 2015-05-19 2054  INR 1.83*    Sepsis Markers  Recent Labs Lab 2015/05/19 2106 05/19/15 2216  LATICACIDVEN 11.28* 8.60*    ABG  Recent Labs Lab May 19, 2015 2118  PHART 7.034*  PCO2ART 59.2*  PO2ART 171.0*    Liver Enzymes  Recent Labs Lab 2015-05-19 2054  AST 110*  ALT 12*  ALKPHOS 101  BILITOT 0.6  ALBUMIN 3.6    Cardiac Enzymes No results for input(s): TROPONINI, PROBNP in the last 168 hours.  Glucose No results for input(s): GLUCAP in the last 168 hours.  Imaging Dg Chest Portable 1 View  05-19-2015  CLINICAL DATA:  Endotracheal and  NG tube placement. EXAM: PORTABLE CHEST 1 VIEW COMPARISON:  None. FINDINGS: Endotracheal tube 5.9 cm from the carina. Enteric tube is in place, tip below the diaphragm not included in the field of view. Multiple overlying monitoring devices. Lung volumes are low. There is bibasilar atelectasis, right greater than left. Heart appears mildly enlarged, suboptimally assessed on the current exam. No pulmonary edema, large pleural effusion or pneumothorax. IMPRESSION: 1. Endotracheal and enteric tubes in place. 2. Low lung volumes with bibasilar atelectasis. Electronically Signed   By: Rubye Oaks M.D.   On: May 19, 2015 21:35     STUDIES:  CXR 02/01 > atx.  CULTURES: Blood 02/01 > Urine 02/01 > Sputum 02/01 >  ANTIBIOTICS: Vanc 02/01 > Zosyn 02/01 >  SIGNIFICANT EVENTS: 02/01 > admitted after cardiac arrest.  LINES/TUBES: ETT 02/02 > R fem a line 02/01 > L fem CVL 02/01 >  DISCUSSION: This is a 75 y.o. M with PMH of parkinson's and dementia.  He was at rehab facility after being discharged from [facility] following admission for aspiration PNA.  He was in his USOH and doing well up until evening of 02/01 when he suffered cardiac arrest.  He had unknown downtime and prolonged resuscitation (roughly 30-35 minutes) prior to ROSC.  In ED, he required epinephrine and norepinephrine infusions for bradycardia and hypotension.  After extensive discussion with family, decision made to continue with current medical support / therapies, but to not perform CPR in the event of future arrest.  ASSESSMENT / PLAN:  PULMONARY A: VDRF due to inability to protect airway - secondary to cardiac arrest. P:   Full vent support. Wean as able. VAP prevention measures. SBT only once mental status improves. CXR in AM.  CARDIOVASCULAR A:  Cardiac arrest - unknown downtime and prolonged resuscitation (30 - 35 minutes). Shock - likely cardiogenic secondary to arrest. Bradycardia. DNR Status. P:  Not a  candidate for hypothermia given unknown downtime and prolonged resuscitation. Continue epinephrine and norepinephrine at current doses - no escalation. Assess echo. Trend troponin / lactate. Consider cardiology consult. Continue outpatient ASA. Hold outpatient labetalol.  RENAL A:   AKI. AGMA - lactate. P:   NS @ 100. BMP in AM.  GASTROINTESTINAL A:   GI prophylaxis. Nutrition. P:   SUP: Pantoprazole. NPO.  HEMATOLOGIC A:   Thrombocytopenia. VTE Prophylaxis. P:  Monitor platelet counts. SCD's / Heparin. CBC in AM.  INFECTIOUS A:   Rule out occult infection. P:   Abx as above (Vanc / Zosyn).  Follow cultures as above. PCT algorithm to limit abx exposure.  ENDOCRINE A:   Hyperglycemia - no hx DM. P:   SSI. Assess Hgb A1c.  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 Hx parkinsons, dementia. P:   Sedation:  Fentanyl PRN / Midazolam PRN. RASS goal: 0  to -1. Daily WUA. EEG. Consider neuro consult for prognostication. Consider palliative care consult in AM. Continue outpatient donepezil. Hold outpatient sinemet.   Family updated: I have had extensive discussions with wife and two sons (although one son is mentally disabled). We discussed Mr. Caspers current circumstances and organ failures. We also discussed patient's prior wishes under circumstances such as this. The family has decided not to perform resuscitation if arrest were to occur, but to otherwise continue with current medical support / therapies without further escalation.  Interdisciplinary Family Meeting v Palliative Care Meeting:  Due by: 02/07.  CC time:  45 minutes.   Rutherford Guys, [place] - [facility] [place] [facility] Pager: [phone]  or [phone] 2015/04/30, 10:34 PM

## 2015-04-27 NOTE — ED Notes (Signed)
Per CCM provider- pt now DNR decided by family; vasoactive drips to remain same and not increased

## 2015-04-27 NOTE — ED Notes (Addendum)
Foley catheter placed at 21:45 on 05/11/2015 by Day Deery T. - EMT.

## 2015-04-27 NOTE — ED Notes (Signed)
Pt shorts placed in patient belonging's bag

## 2015-04-27 NOTE — Progress Notes (Signed)
Advanced ET tube to 28 lip. Was at 25. Per MD order.  Repeat xray being obtained now.

## 2015-04-27 NOTE — ED Notes (Signed)
CCM MD at bedside.  

## 2015-04-27 NOTE — ED Notes (Signed)
This Tech verified that foley site appeared fine.

## 2015-04-28 ENCOUNTER — Other Ambulatory Visit (HOSPITAL_COMMUNITY): Payer: Self-pay

## 2015-04-28 ENCOUNTER — Ambulatory Visit (HOSPITAL_COMMUNITY): Payer: Medicare Other

## 2015-04-28 ENCOUNTER — Inpatient Hospital Stay (HOSPITAL_COMMUNITY): Payer: Medicare Other

## 2015-04-28 LAB — BLOOD GAS, ARTERIAL
Acid-base deficit: 11.4 mmol/L — ABNORMAL HIGH (ref 0.0–2.0)
BICARBONATE: 14.3 meq/L — AB (ref 20.0–24.0)
Drawn by: 252031
FIO2: 0.7
O2 SAT: 94.5 %
PEEP: 5 cmH2O
PH ART: 7.254 — AB (ref 7.350–7.450)
Patient temperature: 98.6
RATE: 24 resp/min
TCO2: 15.4 mmol/L (ref 0–100)
VT: 550 mL
pCO2 arterial: 33.5 mmHg — ABNORMAL LOW (ref 35.0–45.0)
pO2, Arterial: 85.2 mmHg (ref 80.0–100.0)

## 2015-04-28 LAB — GLUCOSE, CAPILLARY: GLUCOSE-CAPILLARY: 318 mg/dL — AB (ref 65–99)

## 2015-04-28 LAB — TROPONIN I: Troponin I: 0.79 ng/mL (ref <0.031)

## 2015-04-28 LAB — LACTIC ACID, PLASMA: Lactic Acid, Venous: 6.2 mmol/L (ref 0.5–2.0)

## 2015-04-28 NOTE — Progress Notes (Signed)
Cardiac death at 73. Two RN's (Okey Zelek Fredric Mare and Deniece Ree) observed/listened pt for no heart tones or pulse for two minutes. Pt's wife and two sons at bedside. Elink MD Mungal notified of pt's death at 26. Pt extubated at 0430. CDS notified and pt is not a candidate for donation. Emotional support provided.

## 2015-04-29 LAB — HEMOGLOBIN A1C
HEMOGLOBIN A1C: 5.9 % — AB (ref 4.8–5.6)
Mean Plasma Glucose: 123 mg/dL

## 2015-05-02 LAB — CULTURE, BLOOD (ROUTINE X 2)
CULTURE: NO GROWTH
Culture: NO GROWTH

## 2015-05-03 ENCOUNTER — Other Ambulatory Visit (HOSPITAL_COMMUNITY): Payer: Medicare Other

## 2015-05-03 ENCOUNTER — Ambulatory Visit (HOSPITAL_COMMUNITY): Payer: Medicare Other

## 2015-05-04 ENCOUNTER — Telehealth: Payer: Self-pay

## 2015-05-04 NOTE — Telephone Encounter (Signed)
On 05/04/2015 I received a death certificate from [employer] (Original). The death certificate is for burial. The patient is a patient of Doctor Ramaswamy. The death certificate will be taken to Pulmonary Unit at [facility] this am for signature. On May 16, 2015 I received the death certificate back from Doctor Ramaswamy. I got the death certificate ready and put the death certificate in the mail to the funeral home per their request.

## 2015-05-25 NOTE — Discharge Summary (Signed)
DISCHARGE SUMMARY    Date of admit: 05/10/2015  8:40 PM Date of discharge: 21-May-2015  4:16 AM Length of Stay: 1 days  PCP is Angelica Chessman., MD   PROBLEM LIST Active Problems:   Cardiac arrest [facility])    SUMMARY Kyle Morrow was 75 y.o. patient with    has a past medical history of Parkinson disease (HCC).   has past surgical history that includes Hip surgery.   Admitted on 05/21/2015 with    Kyle Morrow is a 75 y.o. male with PMH of Parkinson's and Dementia. He had recent admission 04/03/15 through 04/14/15 for aspiration PNA requiring intubation and hypothermia due to cold exposure (was found outside in the snow). Following that admission, he was discharged to [facility] and per family, he had been doing well there.  On evening of 05/02/2015, he was last seen normal at 1630. At 1935, staff found him pulseless. Unknown downtime. EMS was dispatched and on their arrival, CPR was in progress and AED had fired once. EMS administered  epinephrine and 1 defibrillation. Upon arrival to [facility] ED, pt had lost pulses again and was given  epinephrine and 1amp HCO3. He was profoundly bradycardic after ROSC and was therefore started on epinephrine infusion. He was also started on levophed infusion for hypotension.  This is a 75 y.o. M with PMH of parkinson's and dementia. He was at rehab facility after being discharged from [facility] following admission for aspiration PNA. He was in his USOH and doing well up until evening of 02/01 when he suffered cardiac arrest. He had unknown downtime and prolonged resuscitation (roughly 30-35 minutes) prior to ROSC. In ED, he required epinephrine and norepinephrine infusions for bradycardia and hypotension. After extensive discussion with family, decision made to continue with current medical support / therapies, but to not perform CPR in the event of future arrest.   His problems were  Patient Active Problem List   Diagnosis Date  Noted  . Cardiac arrest (HCC) 04/29/2015  . AKI (acute kidney injury) (HCC)   . Lactic acidosis   . Acute respiratory failure with hypoxia (HCC)   . Acute encephalopathy   . Aspiration pneumonia (HCC) 04/25/2015  . Dementia without behavioral disturbance 04/25/2015  . HTN (hypertension) 04/25/2015  . Acute blood loss anemia   . Acute encephalopathy   . Dysphagia   . Hyperglycemia   . Hypernatremia   . Acute respiratory failure with hypoxia (HCC)   . Right elbow pain   . Hypothermia 04/03/2015  . Pressure ulcer 04/03/2015  . Seizure (HCC)   . Acute respiratory failure with hypercapnia (HCC)   . Parkinson disease [facility])     He died 05-21-15          SIGNED Dr. Kalman Shan, M.D., [facility]C.P Pulmonary and Critical Care Medicine Staff Physician [facility] [place] [facility] Critical Care Pager: [phone], If no answer or between  15:00h - 7:00h: call [phone]  05/25/2015 5:07 AM

## 2015-05-25 DEATH — deceased

## 2016-07-25 IMAGING — RF DG SWALLOWING FUNCTION - NRPT [facility]
1 series · 18 of 24 positions shown · non-contrast
Comparison: none

[Series 1: run · 38 acquisitions, 18 frames shown]
[im 1/38]
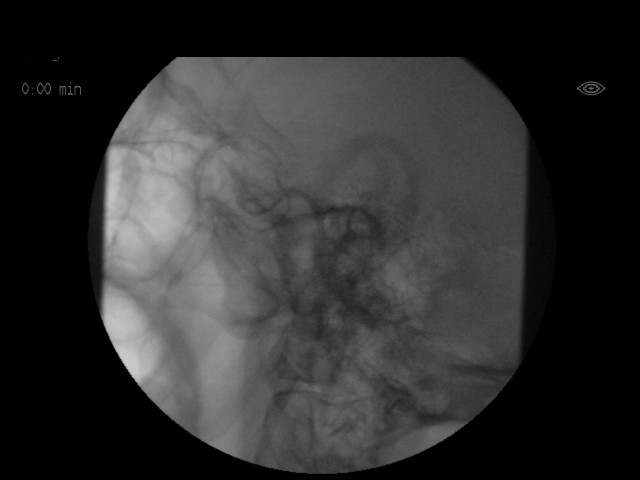
[im 4/38]
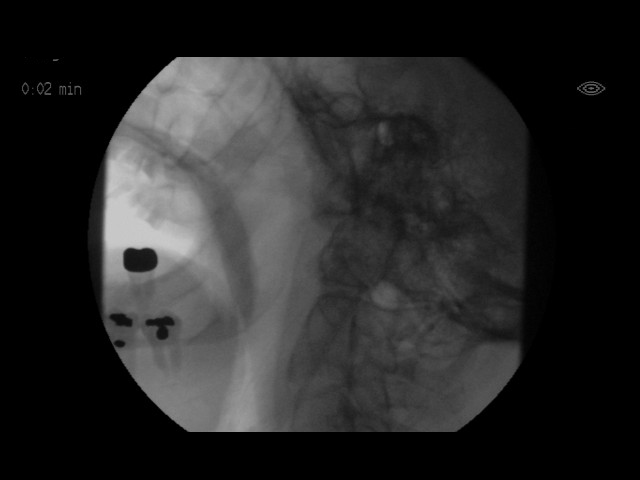
[im 5/38]
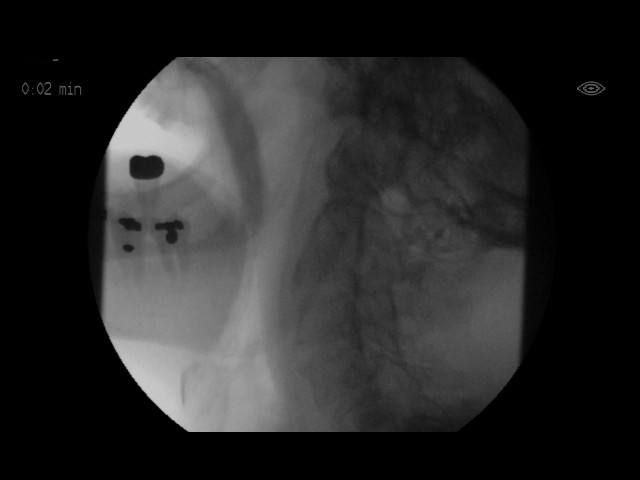
[im 7/38]
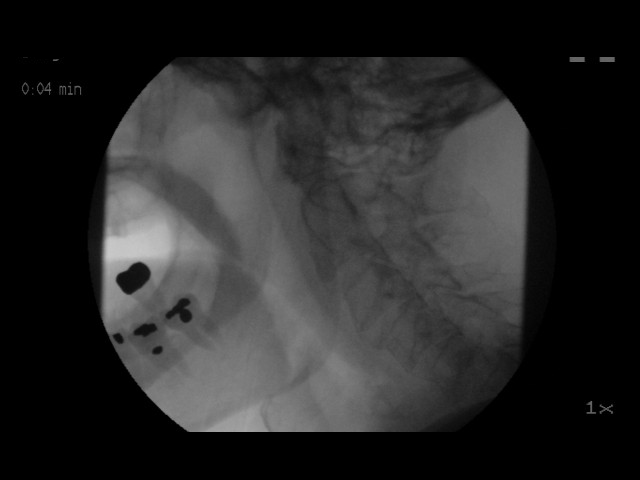
[im 10/38]
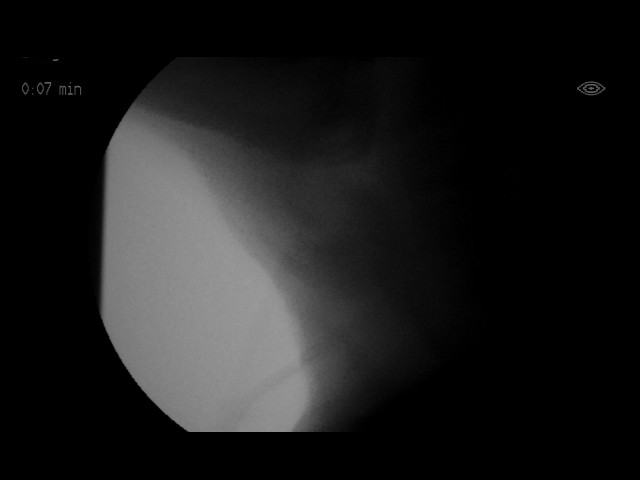
[im 12/38]
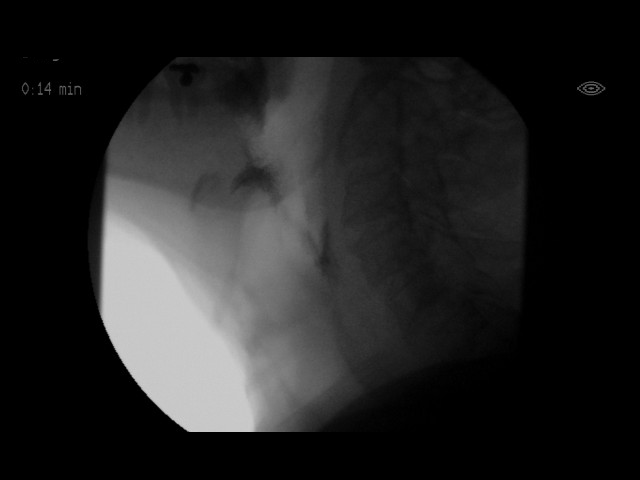
[im 13/38]
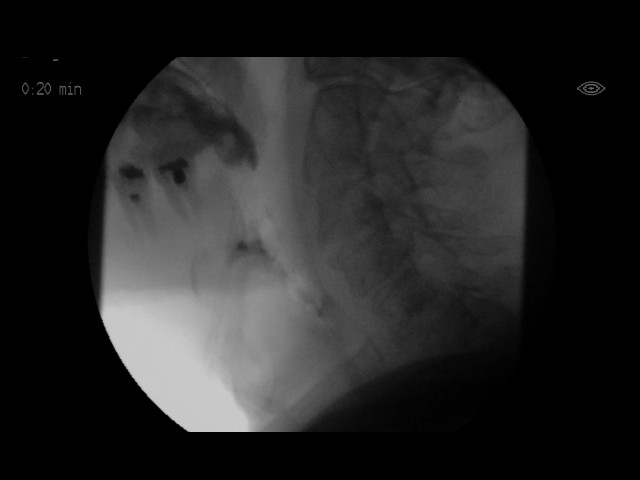
[im 17/38]
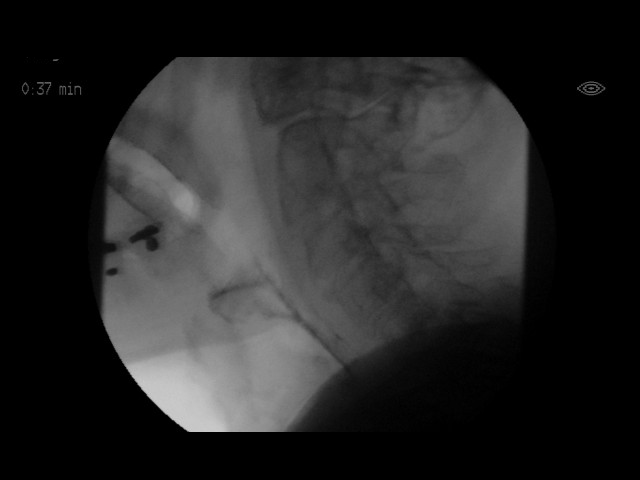
[im 18/38]
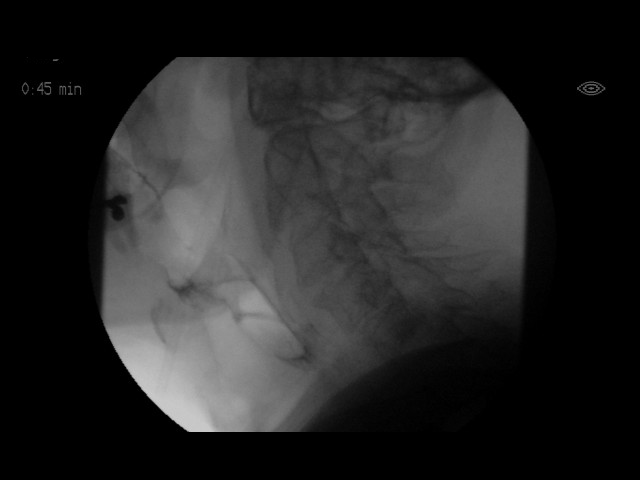
[im 20/38]
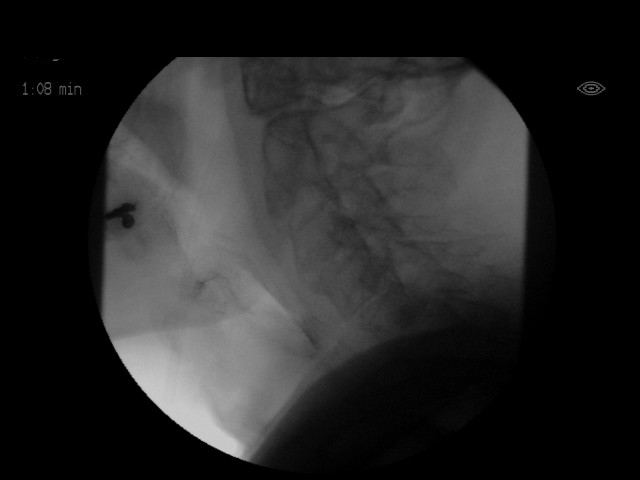
[im 23/38]
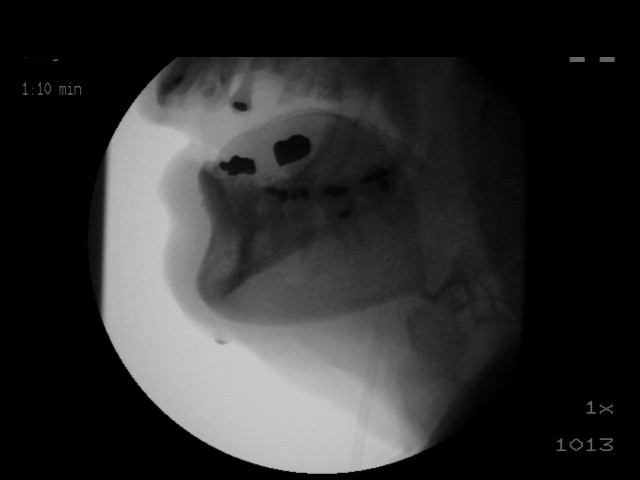
[im 25/38]
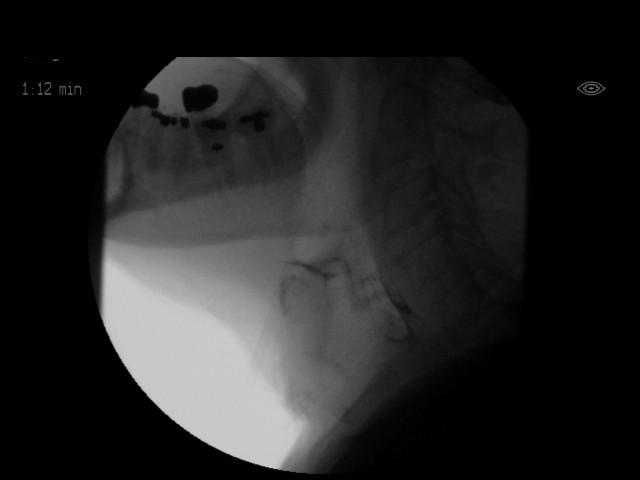
[im 26/38]
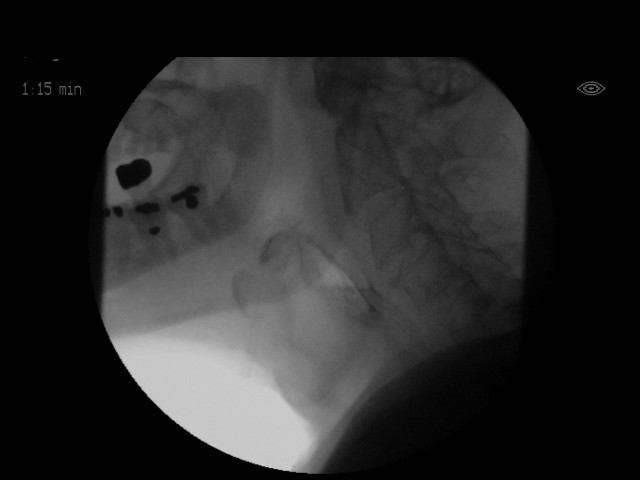
[im 29/38]
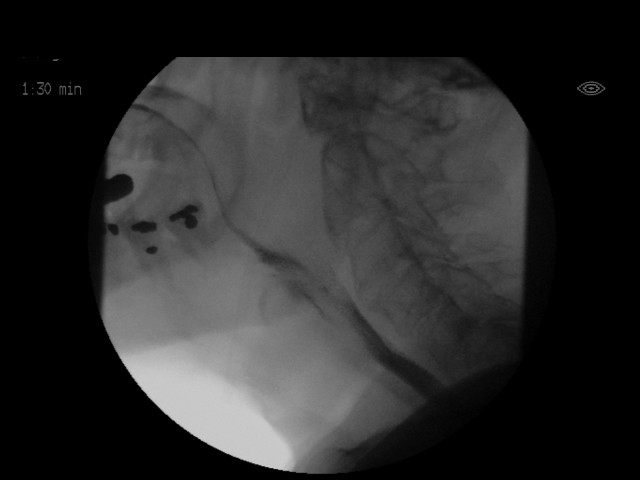
[im 31/38]
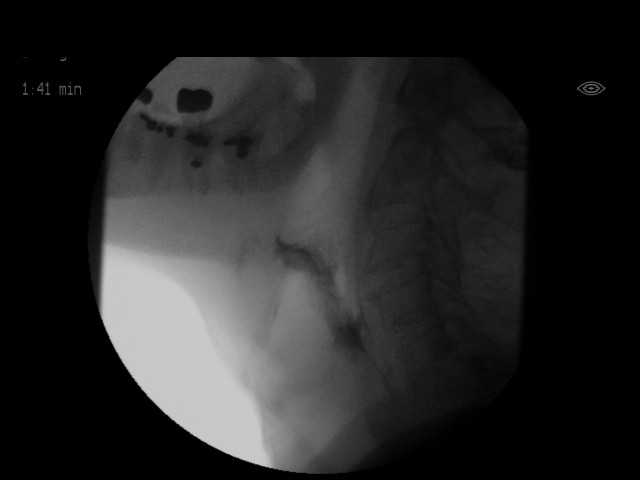
[im 33/38]
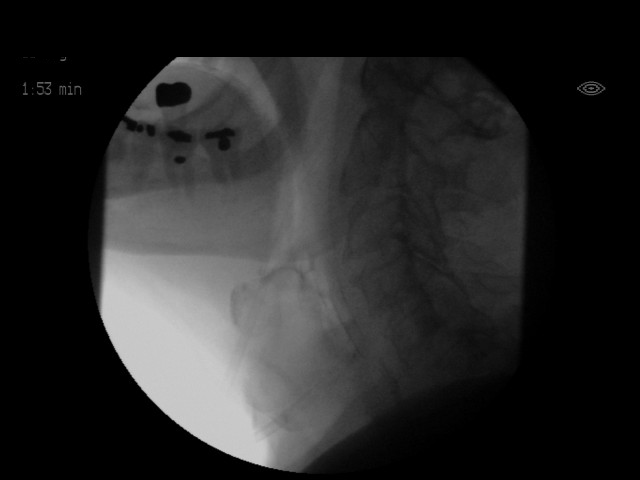
[im 36/38]
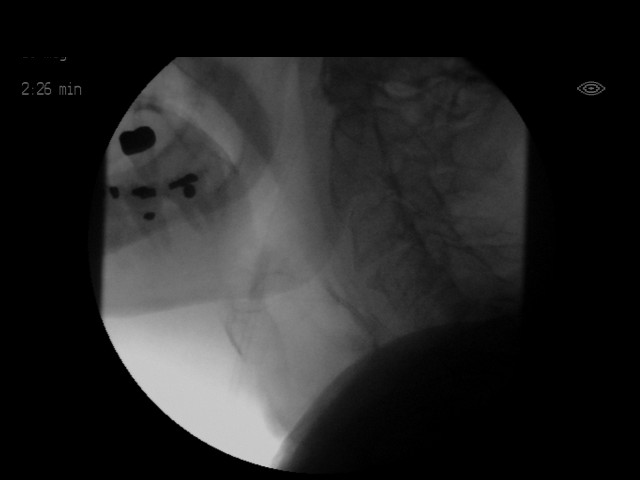
[im 38/38]
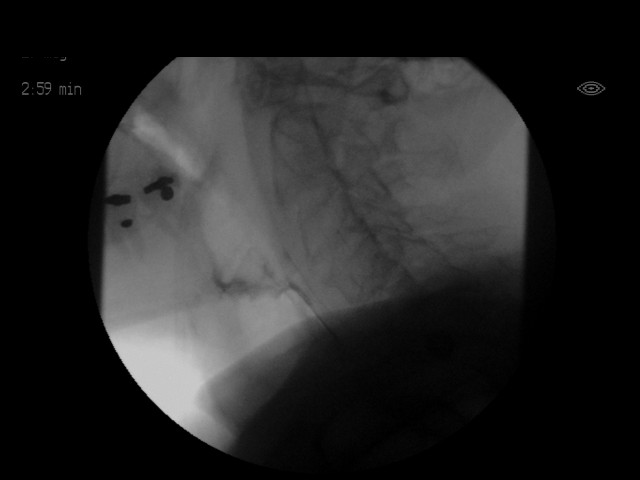

[18 of 24 positions shown; findings below may reference images not displayed]

FLUOROSCOPY FOR SWALLOWING FUNCTION STUDY:
Fluoroscopy was provided for swallowing function study, which was administered by a speech pathologist.  Final results and recommendations from this study are contained within the speech pathology report.

## 2016-08-12 IMAGING — CR DG CHEST 1V PORT
1 series · 1 of 1 positions shown · non-contrast
Comparison: None.

CLINICAL DATA: Endotracheal and NG tube placement.

EXAM:
PORTABLE CHEST 1 VIEW

[AP]
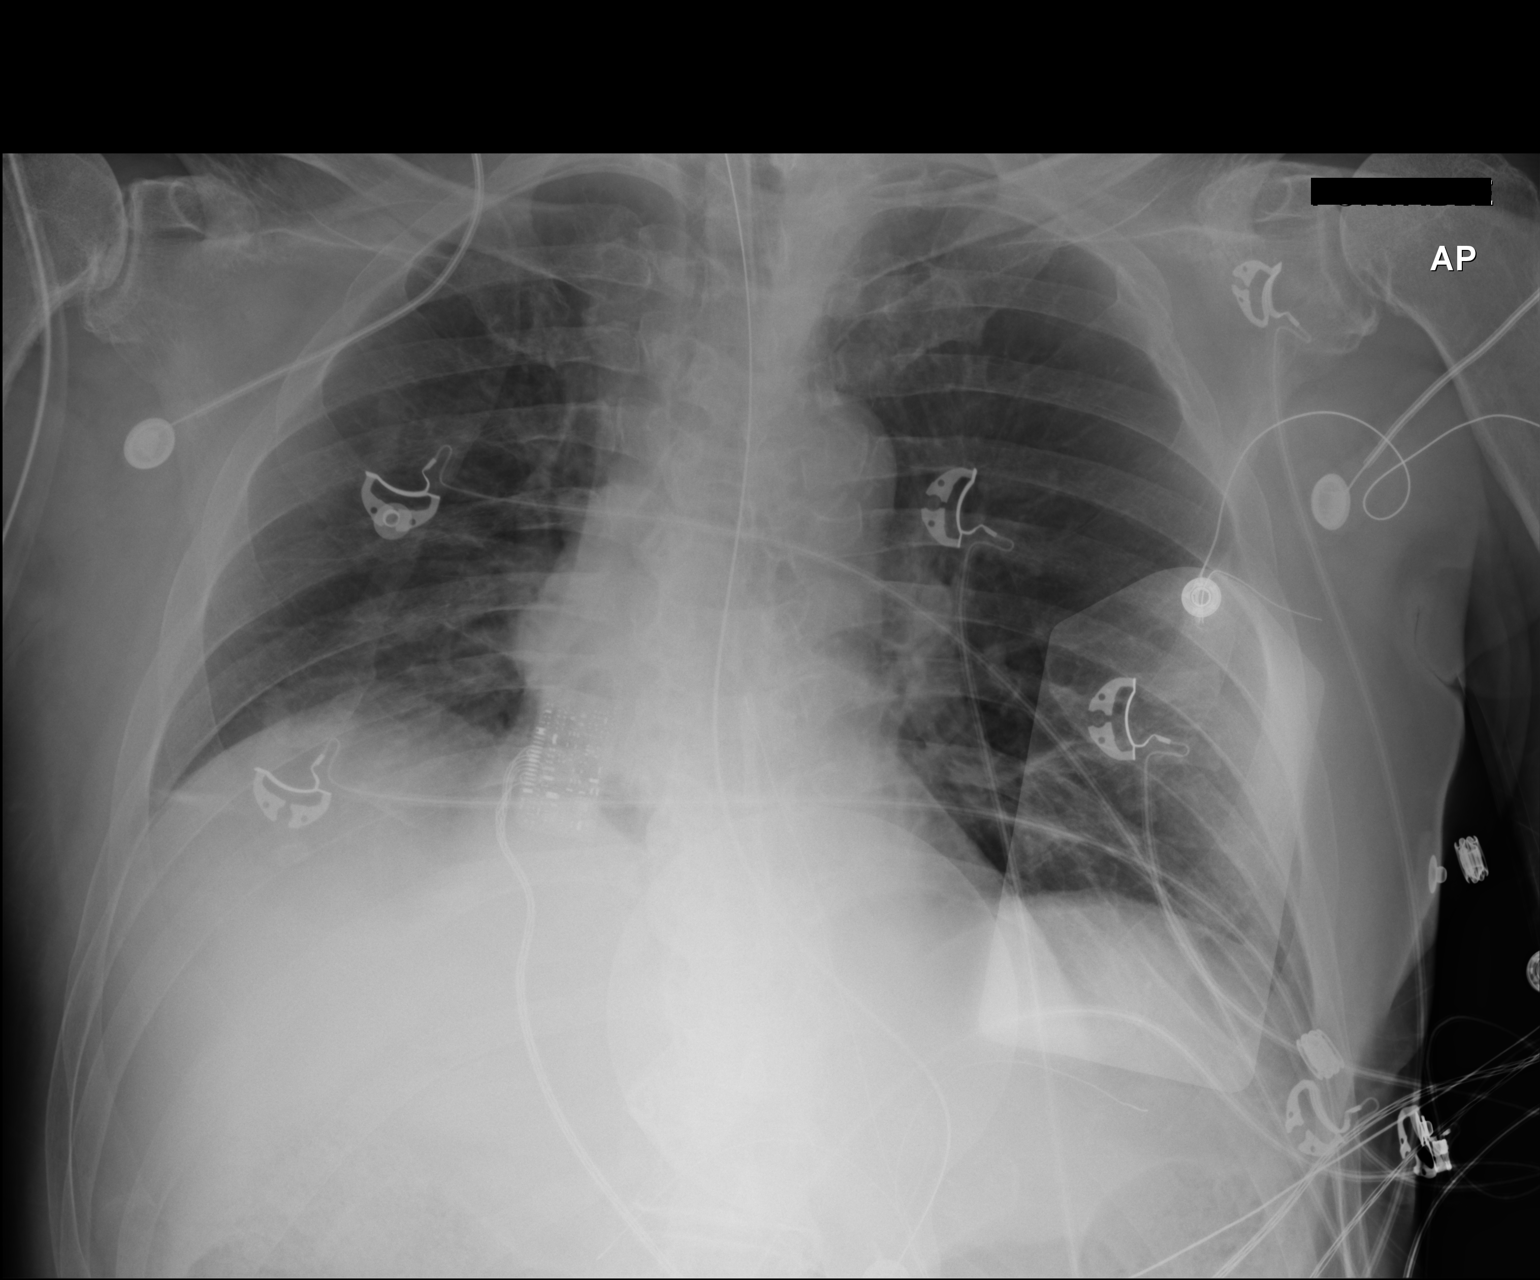

[1 of 1 positions shown; findings below may reference images not displayed]

FINDINGS: Endotracheal tube 5.9 cm from the carina. Enteric tube is in place,
tip below the diaphragm not included in the field of view. Multiple
overlying monitoring devices. Lung volumes are low. There is
bibasilar atelectasis, right greater than left. Heart appears mildly
enlarged, suboptimally assessed on the current exam. No pulmonary
edema, large pleural effusion or pneumothorax.
IMPRESSION: 1. Endotracheal and enteric tubes in place.
2. Low lung volumes with bibasilar atelectasis.

## 2016-08-12 IMAGING — CR DG CHEST 1V PORT
1 series · 1 of 1 positions shown · non-contrast
Comparison: 04/27/2015 at [DATE] p.m.

CLINICAL DATA: Advanced endotracheal tube

EXAM:
PORTABLE CHEST 1 VIEW

[AP]
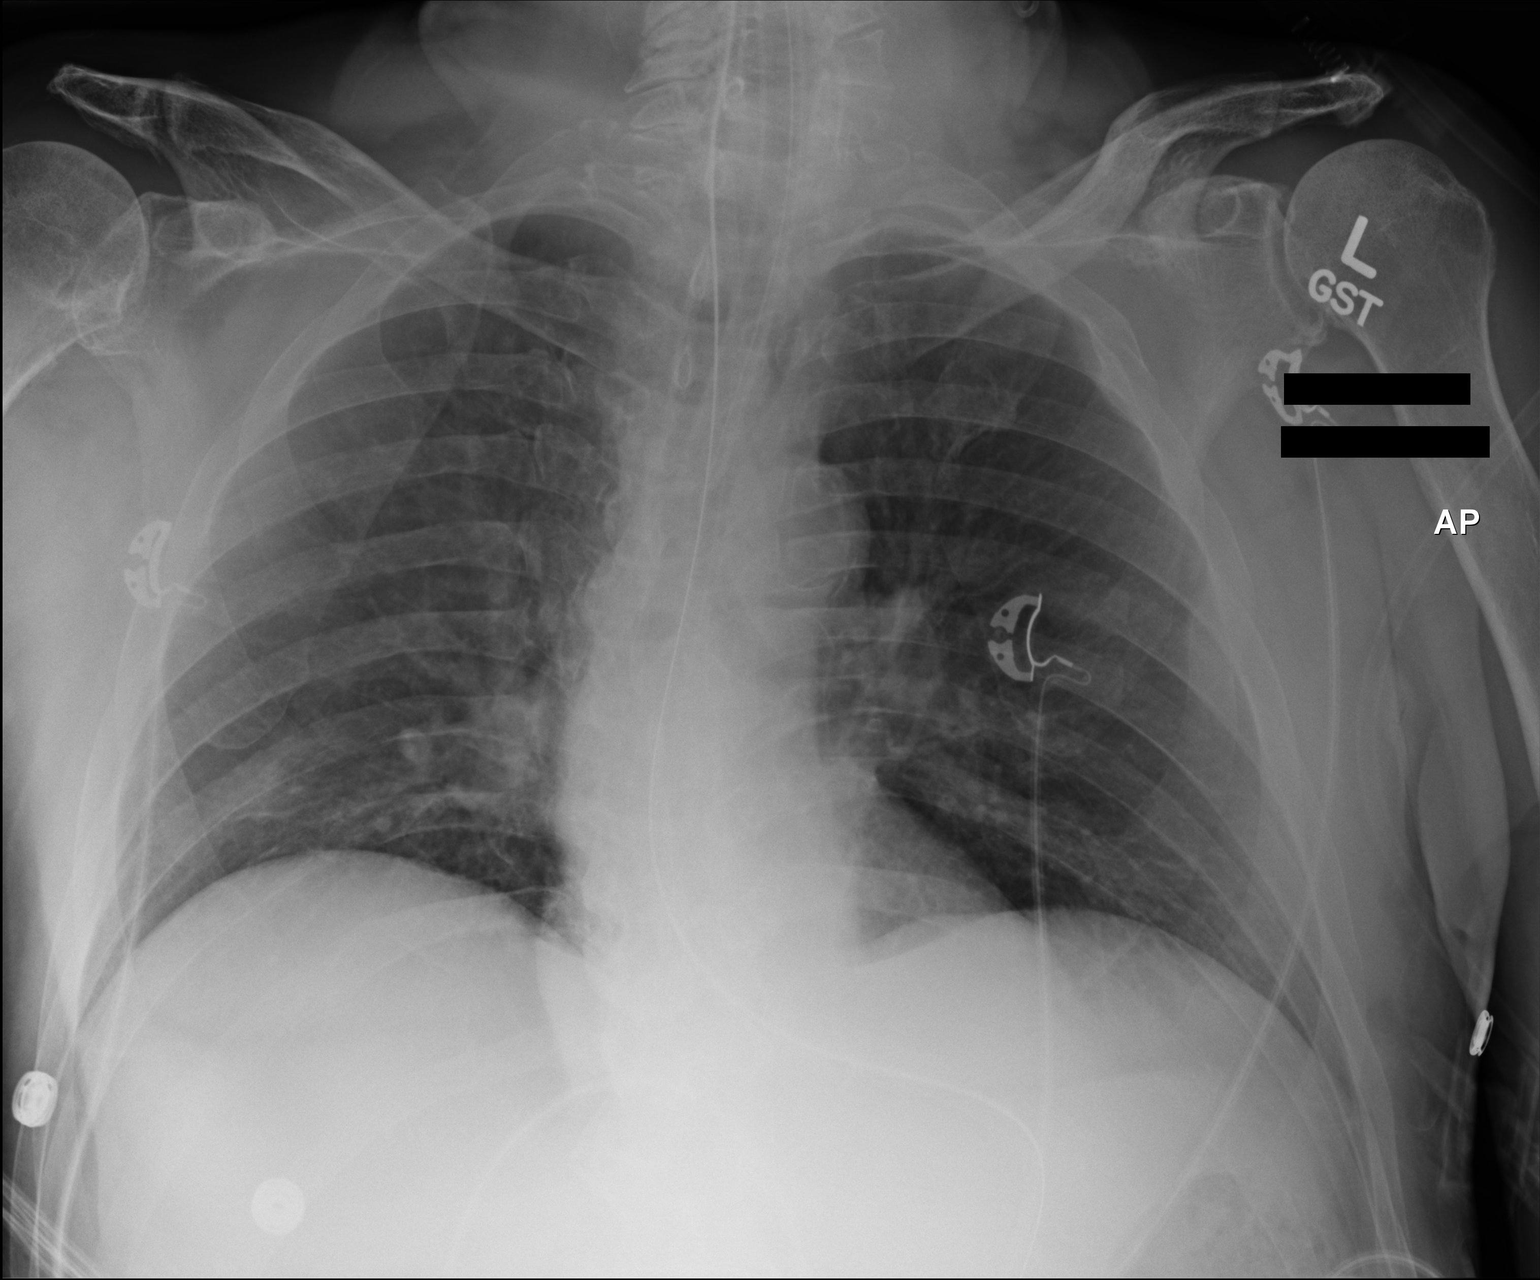

[1 of 1 positions shown; findings below may reference images not displayed]

FINDINGS: Advanced endotracheal tube with tip between the clavicular heads and
carina. An orogastric tube at least reaches the stomach.

Mild improvement in lung volumes with improved atelectasis. No
edema, effusion, or air leak. Normal heart size.
IMPRESSION: 1. Unremarkable positioning of endotracheal and orogastric tubes.
2. Mildly improved inflation.
